# Patient Record
Sex: Female | Born: 1960 | Race: White | Hispanic: No | Marital: Married | Smoking: Never smoker
Health system: Southern US, Community
[De-identification: ages and names within clinical notes are randomized; demographics above are authoritative.]

## PROBLEM LIST (undated history)

## (undated) DIAGNOSIS — T7840XA Allergy, unspecified, initial encounter: Secondary | ICD-10-CM

## (undated) DIAGNOSIS — H532 Diplopia: Secondary | ICD-10-CM

## (undated) HISTORY — PX: ABDOMINAL HYSTERECTOMY: SHX81

## (undated) HISTORY — DX: Diplopia: H53.2

## (undated) HISTORY — PX: APPENDECTOMY: SHX54

## (undated) HISTORY — DX: Allergy, unspecified, initial encounter: T78.40XA

---

## 2012-09-20 ENCOUNTER — Ambulatory Visit: Payer: Self-pay | Admitting: Family

## 2012-10-29 ENCOUNTER — Ambulatory Visit: Payer: Self-pay | Admitting: Family Medicine

## 2012-12-02 ENCOUNTER — Ambulatory Visit (INDEPENDENT_AMBULATORY_CARE_PROVIDER_SITE_OTHER): Payer: Managed Care, Other (non HMO) | Admitting: Internal Medicine

## 2012-12-02 VITALS — BP 118/76 | HR 84 | Temp 99.1°F | Resp 16 | Ht 65.5 in | Wt 175.4 lb

## 2012-12-02 DIAGNOSIS — J039 Acute tonsillitis, unspecified: Secondary | ICD-10-CM

## 2012-12-02 NOTE — Progress Notes (Signed)
  Subjective:    Patient ID: Leah Foley, female    DOB: [birth year], 52 y.o.   MRN: [id number]  HPIc/o 24h of sore thr No cough or congestion Pos chills Just moved here    Review of Systems     Objective:   Physical Exam BP 118/76  Pulse 84  Temp(Src) 99.1 F (37.3 C) (Oral)  Resp 16  Ht 5' 5.5" (1.664 m)  Wt 175 lb 6.4 oz (79.561 kg)  BMI 28.73 kg/m2  SpO2 98% tms cl nar cl thr red w/ exudate 2+ac nodes tender ches cl   RS-neg      Assessment & Plan:  tonsilitis  Throat culture sent She elects symptomatic otc treatment until results

## 2012-12-03 ENCOUNTER — Telehealth: Payer: Self-pay

## 2012-12-03 NOTE — Telephone Encounter (Signed)
Pt was seen here yesterday and she is calling because her ears have been bothering her and her ear has popped and it seems like it is plugged up She also states that when she bends over it is a shooting pain Call back number [phone]

## 2012-12-04 MED ORDER — IPRATROPIUM BROMIDE 0.03 % NA SOLN: 2.0000 | Freq: Two times a day (BID) | NASAL | Status: DC

## 2012-12-04 NOTE — Telephone Encounter (Signed)
See OV-dx tonsillitis This could make ears hurt Awaiting thr cult results I can't remember if I looked inher ears or not tho epic says i did

## 2012-12-04 NOTE — Telephone Encounter (Signed)
Sent Atrovent nasal to pharmacy.  Use BID to help with ear pressure.  May also use Advil/Tylenol for pain relief.  If worsening or not improving, RTC.

## 2012-12-04 NOTE — Telephone Encounter (Signed)
My best guess w/out looking in the ears again would be to suspect an effusion due to her illness with possible infection and treat with Amoxicillin-then look in the ear again in 7-10 days to see if everything is back to normal---it needs to be looked at to prove it is well

## 2012-12-04 NOTE — Telephone Encounter (Signed)
Called her, to advise. Left message for her to call me back.

## 2012-12-04 NOTE — Telephone Encounter (Signed)
Lengthy conversation with patient. She does not want to try the nasal spray. She states Dr Merla Riches did not look in her ears, she also states she was "out of it" your note states TM's clear. She states she has been taking Tylenol and Advil. Has been rotating this. She does not want to come back in. She states she wants to resolve this, please advise.

## 2012-12-05 LAB — CULTURE, GROUP A STREP

## 2012-12-15 ENCOUNTER — Encounter: Payer: Self-pay | Admitting: Emergency Medicine

## 2012-12-15 ENCOUNTER — Emergency Department
Admission: EM | Admit: 2012-12-15 | Discharge: 2012-12-15 | Disposition: A | Discharge status: Home / Self Care | Payer: 59 | Source: Home / Self Care | Attending: Family Medicine | Admitting: Family Medicine

## 2012-12-15 DIAGNOSIS — M940 Chondrocostal junction syndrome [Tietze]: Secondary | ICD-10-CM

## 2012-12-15 DIAGNOSIS — B309 Viral conjunctivitis, unspecified: Secondary | ICD-10-CM

## 2012-12-15 DIAGNOSIS — J069 Acute upper respiratory infection, unspecified: Secondary | ICD-10-CM

## 2012-12-15 LAB — POCT CBC W AUTO DIFF (K'VILLE URGENT CARE)

## 2012-12-15 MED ORDER — POLYMYXIN B-TRIMETHOPRIM 10000-0.1 UNIT/ML-% OP SOLN: 1.0000 [drp] | OPHTHALMIC | Status: DC

## 2012-12-15 NOTE — ED Notes (Signed)
Reports onset of congestion, cough, eye and ear pain weeks ago; has been treated at 2 other facilities and still not resolved.

## 2012-12-15 NOTE — ED Provider Notes (Signed)
History     CSN: [id number]  Arrival date & time 12/15/12  1249   First MD Initiated Contact with Patient 12/15/12 1411      Chief Complaint  Patient presents with  . Cough  . Eye Pain  . Hoarse  . Otalgia       HPI Comments:  Patient states that two weeks ago she developed a mild sore throat, followed by nasal congestion and a cough.  She presented to another medical clinic where a strep test was negative.  She received an injectable steroid at that time.  Her cough has persisted.  Last night her right eye became red, and today both eyes are red but not painful.  She remains fatigued.  The history is provided by the patient.    Past Medical History   Diagnosis Date  . Allergy     Past Surgical History  Procedure Laterality Date  . Appendectomy    . Abdominal hysterectomy      Family History  Problem Relation Age of Onset  . Lupus Mother   . Cancer Father   . Kidney disease Maternal Grandfather     History  Substance Use Topics  . Smoking status: Never Smoker   . Smokeless tobacco: Not on file  . Alcohol Use: No    OB History   Grav Para Term Preterm Abortions TAB SAB Ect Mult Living                  Review of Systems + sore throat resolved + cough No pleuritic pain No wheezing + nasal congestion ? post-nasal drainage No sinus pain/pressure + itchy/red eyes ? earache No hemoptysis No SOB No fever/chills No nausea No vomiting No abdominal pain No diarrhea No urinary symptoms No skin rashes + fatigue No myalgias No headache    Allergies  Azithromycin and Sulfa antibiotics  Home Medications   Current Outpatient Rx  Name  Route  Sig  Dispense  Refill  . cefdinir (OMNICEF) 300 MG capsule   Oral   Take 300 mg by mouth 2 (two) times daily.         Marland Kitchen ipratropium (ATROVENT) 0.03 % nasal spray   Nasal   Place 2 sprays into the nose 2 (two) times daily.   30 mL   1   . trimethoprim-polymyxin b (POLYTRIM) ophthalmic solution   Both Eyes   Place 1 drop into both eyes every 4 (four) hours. (Rx void after 12/23/12)   10 mL   0     BP 112/74  Pulse 88  Temp(Src) 97.9 F (36.6 C) (Oral)  Resp 16  Ht 5\' 6"  (1.676 m)  Wt 174 lb (78.926 kg)  BMI 28.1 kg/m2  SpO2 99%  Physical Exam Nursing notes and Vital Signs reviewed. Appearance:  Patient appears healthy, stated age, and in no acute distress Eyes:  Pupils are equal, round, and reactive to light and accomodation.  Extraocular movement is intact.  Conjunctivae are injected bilaterally.  No discharge.  No photophobia Ears:  Canals normal.  Tympanic membranes normal.  Nose:  Mildly congested turbinates.  No sinus tenderness.  Pharynx:   Normal Neck:  Supple.  Slightly tender shotty posterior nodes are palpated bilaterally  Lungs:  Clear to auscultation.  Breath sounds are equal.  Chest:  Distinct tenderness to palpation over the mid-sternum.  Heart:  Regular rate and rhythm without murmurs, rubs, or gallops.  Abdomen:  Nontender without masses or hepatosplenomegaly.  Bowel sounds are present.  No CVA or flank tenderness.  Extremities:  No edema.  No calf tenderness Skin:  No rash present.   ED Course  Procedures  none  Labs Reviewed  POCT CBC W AUTO DIFF ([facility])  WBC 8.2; LY 27.5; MO 6.9; GR 65.6; Hgb 14.6; Platelets 298       1. Viral conjunctivitis   2. Acute upper respiratory infections of unspecified site; note normal white blood count.   3. Costochondritis       MDM   There is no evidence of bacterial infection today.   Treat symptomatically for now  Use saline nasal irrigation.  Try refrigerated "Refresh Tears" lubricating eye drops several times daily.  If eyes become increasingly red  and irritated, begin antibiotic eye drops (Rx given for PolyTrim) Followup with Family Doctor if not improved in one week.         Lattie Haw, MD 12/19/12 [phone]

## 2012-12-20 ENCOUNTER — Telehealth: Payer: Self-pay | Admitting: Emergency Medicine

## 2013-01-30 ENCOUNTER — Ambulatory Visit: Payer: Self-pay | Admitting: Family Medicine

## 2013-02-20 ENCOUNTER — Ambulatory Visit (INDEPENDENT_AMBULATORY_CARE_PROVIDER_SITE_OTHER): Payer: Self-pay | Admitting: Family Medicine

## 2013-02-20 ENCOUNTER — Encounter: Payer: Self-pay | Admitting: Family Medicine

## 2013-02-20 VITALS — BP 111/73 | HR 78 | Ht 65.25 in | Wt 173.0 lb

## 2013-02-20 DIAGNOSIS — J309 Allergic rhinitis, unspecified: Secondary | ICD-10-CM

## 2013-02-20 DIAGNOSIS — D179 Benign lipomatous neoplasm, unspecified: Secondary | ICD-10-CM

## 2013-02-20 DIAGNOSIS — Z0000 Encounter for general adult medical examination without abnormal findings: Secondary | ICD-10-CM

## 2013-02-20 MED ORDER — MONTELUKAST SODIUM 10 MG PO TABS: 10.0000 mg | ORAL_TABLET | Freq: Every day | ORAL | Status: DC

## 2013-02-20 MED ORDER — MOMETASONE FUROATE 50 MCG/ACT NA SUSP: 2.0000 | Freq: Every day | NASAL | Status: DC

## 2013-02-20 NOTE — Progress Notes (Signed)
  Subjective:    Patient ID: Leah Foley, female    DOB: 1960-09-04, 52 y.o.   MRN: [id number]  HPI  Leah Foley is here today to establish care with our practice.  She and her family recently moved to [facility] from [place] and she wants to have a PCP for her general care.  Since moving to the area, she has been in at a local urgent care for her allergies.  Her only concern today are her arms.  She has had several lumps on them for many years.  She denies any pain or changes on these lumps.   Review of Systems  Constitutional: Negative for fatigue and unexpected weight change.  HENT: Negative.   Respiratory: Negative.   Cardiovascular: Negative.   Gastrointestinal: Negative.   Genitourinary: Negative.   Musculoskeletal: Negative.   Skin:       Lumps in her left arm  Neurological: Negative.   Hematological: Negative.   Psychiatric/Behavioral: Negative.     Past Medical History  Diagnosis Date  . Allergy     Family History  Problem Relation Age of Onset  . Lupus Mother   . Cancer Father   . Kidney disease Maternal Grandfather    History   Social History Narrative   Marital Status:  Married Government social research officer)    Children:  Sons (3) Daughter (1)     Pets: None    Living Situation: Lives with her husband and daughter.     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 No distress.  HENT:  Head: Normocephalic.  Eyes: No scleral icterus.  Neck: No thyromegaly present.  Cardiovascular: Normal rate, regular rhythm and normal heart sounds.   Pulmonary/Chest: Effort normal and breath sounds normal.  Abdominal: There is no tenderness.  Musculoskeletal: She exhibits no edema and no tenderness.  She has several  lumps on her arms that appear to be lipomas.    Neurological: She is alert.  Skin: Skin is warm and dry.  Psychiatric: She has a normal mood and affect. Her behavior is normal. Judgment and thought content normal.          Assessment & Plan:

## 2013-02-24 LAB — CBC WITH DIFFERENTIAL/PLATELET
Basophils Absolute: 0 10*3/uL (ref 0.0–0.1)
Basophils Relative: 1 % (ref 0–1)
Eosinophils Absolute: 0.1 10*3/uL (ref 0.0–0.7)
Eosinophils Relative: 2 % (ref 0–5)
HCT: 44.3 % (ref 36.0–46.0)
Hemoglobin: 14.9 g/dL (ref 12.0–15.0)
Lymphocytes Relative: 38 % (ref 12–46)
Lymphs Abs: 2.2 10*3/uL (ref 0.7–4.0)
MCH: 29.7 pg (ref 26.0–34.0)
MCHC: 33.6 g/dL (ref 30.0–36.0)
MCV: 88.2 fL (ref 78.0–100.0)
Monocytes Absolute: 0.4 10*3/uL (ref 0.1–1.0)
Monocytes Relative: 7 % (ref 3–12)
Neutro Abs: 3 10*3/uL (ref 1.7–7.7)
Neutrophils Relative %: 52 % (ref 43–77)
Platelets: 205 10*3/uL (ref 150–400)
RBC: 5.02 MIL/uL (ref 3.87–5.11)
RDW: 13.8 % (ref 11.5–15.5)
WBC: 5.7 10*3/uL (ref 4.0–10.5)

## 2013-02-25 LAB — COMPLETE METABOLIC PANEL WITH GFR
ALT: 15 U/L (ref 0–35)
AST: 16 U/L (ref 0–37)
Albumin: 4.5 g/dL (ref 3.5–5.2)
Alkaline Phosphatase: 77 U/L (ref 39–117)
BUN: 10 mg/dL (ref 6–23)
CO2: 28 mEq/L (ref 19–32)
Calcium: 9.4 mg/dL (ref 8.4–10.5)
Chloride: 100 mEq/L (ref 96–112)
Creat: 0.61 mg/dL (ref 0.50–1.10)
GFR, Est African American: >89 mL/min
GFR, Est Non African American: >89 mL/min
Glucose, Bld: 87 mg/dL (ref 70–99)
Potassium: 4 mEq/L (ref 3.5–5.3)
Sodium: 138 mEq/L (ref 135–145)
Total Bilirubin: 0.9 mg/dL (ref 0.3–1.2)
Total Protein: 7.4 g/dL (ref 6.0–8.3)

## 2013-02-25 LAB — LIPID PANEL
Cholesterol: 224 mg/dL — ABNORMAL HIGH (ref 0–200)
HDL: 72 mg/dL (ref >39)
LDL Cholesterol: 121 mg/dL — ABNORMAL HIGH (ref 0–99)
Total CHOL/HDL Ratio: 3.1 Ratio
Triglycerides: 155 mg/dL — ABNORMAL HIGH (ref <150)
VLDL: 31 mg/dL (ref 0–40)

## 2013-02-25 LAB — TSH: TSH: 0.976 u[IU]/mL (ref 0.350–4.500)

## 2013-03-18 ENCOUNTER — Encounter: Payer: Self-pay | Admitting: Family Medicine

## 2013-03-18 ENCOUNTER — Ambulatory Visit (INDEPENDENT_AMBULATORY_CARE_PROVIDER_SITE_OTHER): Payer: Private Health Insurance - Indemnity | Admitting: Family Medicine

## 2013-03-18 VITALS — BP 107/73 | HR 81 | Resp 16 | Wt 173.0 lb

## 2013-03-18 DIAGNOSIS — E785 Hyperlipidemia, unspecified: Secondary | ICD-10-CM

## 2013-03-18 DIAGNOSIS — D179 Benign lipomatous neoplasm, unspecified: Secondary | ICD-10-CM | POA: Insufficient documentation

## 2013-03-18 DIAGNOSIS — J309 Allergic rhinitis, unspecified: Secondary | ICD-10-CM | POA: Insufficient documentation

## 2013-03-18 DIAGNOSIS — Z23 Encounter for immunization: Secondary | ICD-10-CM

## 2013-03-18 DIAGNOSIS — Z0000 Encounter for general adult medical examination without abnormal findings: Secondary | ICD-10-CM | POA: Insufficient documentation

## 2013-03-18 NOTE — Assessment & Plan Note (Signed)
It might be a good idea for her to see a surgeon to have them determine if she needs to have one of these lumps removed to confirm that they are just lipomas.

## 2013-03-18 NOTE — Progress Notes (Signed)
  Subjective:    Patient ID: Leah Foley, female    DOB: October 20, 1960, 52 y.o.   MRN: [id number]  HPI  Sonam is here today to go over her most recent lab results.  She also has additional questions about the lipomas on her arms.    Review of Systems  Constitutional: Negative for activity change, fatigue and unexpected weight change.  HENT: Negative.   Eyes: Negative.   Respiratory: Negative for shortness of breath.   Cardiovascular: Negative for chest pain, palpitations and leg swelling.  Gastrointestinal: Negative for diarrhea and constipation.  Endocrine: Negative.   Genitourinary: Negative for difficulty urinating.  Musculoskeletal: Negative.   Skin: Negative.        Lumps in her right arm   Neurological: Negative.   Hematological: Negative for adenopathy. Does not bruise/bleed easily.  Psychiatric/Behavioral: Negative for sleep disturbance and dysphoric mood. The patient is not nervous/anxious.     Past Medical History  Diagnosis Date  . Allergy     Family History  Problem Relation Age of Onset  . Lupus Mother   . Cancer Father   . Kidney disease Maternal Grandfather     History   Social History Narrative   Marital Status:  Married Government social research officer)    Children:  Sons (3) Daughter (1)     Pets: None    Living Situation: Lives with her husband and daughter.     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 Constitutional: She is oriented to person, place, and time.  Eyes: Conjunctivae are normal. No scleral icterus.  Neck: Neck supple. No thyromegaly present.  Cardiovascular: Normal rate, regular rhythm and normal heart sounds.   Pulmonary/Chest: Effort normal and breath sounds normal.  Musculoskeletal: She exhibits no edema  and no tenderness.  Lipomas in arms   Lymphadenopathy:    She has no cervical adenopathy.  Neurological: She is alert and oriented to person, place, and time.  Skin: Skin is warm and dry.  Psychiatric: She has a normal mood and affect. Her behavior is normal. Judgment and thought content normal.          Assessment & Plan:

## 2013-03-18 NOTE — Assessment & Plan Note (Signed)
She was given medications for Singulair and Nasonex and we discussed how and when she should take them.

## 2013-03-18 NOTE — Assessment & Plan Note (Signed)
She is to get labs then return for a CPE.

## 2013-03-18 NOTE — Patient Instructions (Addendum)
1)  Cholesterol  LDL (Bad)  - You can lower by increasing your fiber intake to 30 grams per day.  You can also add products with a "sterol" like the Smart Balance products or Benacol.  Limit red meat. Try the smoothie that we discussed.    HDL (Good)  - Your level is great!!  Triglycerides - Limit foods that have a high glycemic index.      Fat and Cholesterol Control Diet Cholesterol levels in your body are determined significantly by your diet. Cholesterol levels may also be related to heart disease. The following material helps to explain this relationship and discusses what you can do to help keep your heart healthy. Not all cholesterol is bad. Low-density lipoprotein (LDL) cholesterol is the "bad" cholesterol. It may cause fatty deposits to build up inside your arteries. High-density lipoprotein (HDL) cholesterol is "good." It helps to remove the "bad" LDL cholesterol from your blood. Cholesterol is a very important risk factor for heart disease. Other risk factors are high blood pressure, smoking, stress, heredity, and weight. The heart muscle gets its supply of blood through the coronary arteries. If your LDL cholesterol is high and your HDL cholesterol is low, you are at risk for having fatty deposits build up in your coronary arteries. This leaves less room through which blood can flow. Without sufficient blood and oxygen, the heart muscle cannot function properly and you may feel chest pains (angina pectoris). When a coronary artery closes up entirely, a part of the heart muscle may die causing a heart attack (myocardial infarction). CHECKING CHOLESTEROL When your caregiver sends your blood to a lab to be examined for cholesterol, a complete lipid (fat) profile may be done. With this test, the total amount of cholesterol and levels of LDL and HDL are determined. Triglycerides are a type of fat that circulates in the blood. They can also be used to determine heart disease risk. The list below  describes what the numbers should be: Test: Total Cholesterol.  Less than 200 mg/dl. Test: LDL "bad cholesterol."  Less than 100 mg/dl.  Less than 70 mg/dl if you are at very high risk of a heart attack or sudden cardiac death. Test: HDL "good cholesterol."  Greater than 50 mg/dl for women.  Greater than 40 mg/dl for men. Test: Triglycerides.  Less than 150 mg/dl. CONTROLLING CHOLESTEROL WITH DIET Although exercise and lifestyle factors are important, your diet is key. That is because certain foods are known to raise cholesterol and others to lower it. The goal is to balance foods for their effect on cholesterol and more importantly, to replace saturated and trans fat with other types of fat, such as monounsaturated fat, polyunsaturated fat, and omega-3 fatty acids. On average, a person should consume no more than 15 to 17 g of saturated fat daily. Saturated and trans fats are considered "bad" fats, and they will raise LDL cholesterol. Saturated fats are primarily found in animal products such as meats, butter, and cream. However, that does not mean you need to give up all your favorite foods. Today, there are good tasting, low-fat, low-cholesterol substitutes for most of the things you like to eat. Choose low-fat or nonfat alternatives. Choose round or loin cuts of red meat. These types of cuts are lowest in fat and cholesterol. Chicken (without the skin), fish, veal, and ground [place] breast are great choices. Eliminate fatty meats, such as hot dogs and salami. Even shellfish have little or no saturated fat. Have a 3 oz (  85 g) portion when you eat lean meat, poultry, or fish. Trans fats are also called "partially hydrogenated oils." They are oils that have been scientifically manipulated so that they are solid at room temperature resulting in a longer shelf life and improved taste and texture of foods in which they are added. Trans fats are found in stick margarine, some tub margarines,  cookies, crackers, and baked goods.  When baking and cooking, oils are a great substitute for butter. The monounsaturated oils are especially beneficial since it is believed they lower LDL and raise HDL. The oils you should avoid entirely are saturated tropical oils, such as coconut and palm.  Remember to eat a lot from food groups that are naturally free of saturated and trans fat, including fish, fruit, vegetables, beans, grains (barley, rice, couscous, bulgur wheat), and pasta (without cream sauces).  IDENTIFYING FOODS THAT LOWER CHOLESTEROL  Soluble fiber may lower your cholesterol. This type of fiber is found in fruits such as apples, vegetables such as broccoli, potatoes, and carrots, legumes such as beans, peas, and lentils, and grains such as barley. Foods fortified with plant sterols (phytosterol) may also lower cholesterol. You should eat at least 2 g per day of these foods for a cholesterol lowering effect.  Read package labels to identify low-saturated fats, trans fat free, and low-fat foods at the supermarket. Select cheeses that have only 2 to 3 g saturated fat per ounce. Use a heart-healthy tub margarine that is free of trans fats or partially hydrogenated oil. When buying baked goods (cookies, crackers), avoid partially hydrogenated oils. Breads and muffins should be made from whole grains (whole-wheat or whole oat flour, instead of "flour" or "enriched flour"). Buy non-creamy canned soups with reduced salt and no added fats.  FOOD PREPARATION TECHNIQUES  Never deep-fry. If you must fry, either stir-fry, which uses very little fat, or use non-stick cooking sprays. When possible, broil, bake, or roast meats, and steam vegetables. Instead of putting butter or margarine on vegetables, use lemon and herbs, applesauce, and cinnamon (for squash and sweet potatoes), nonfat yogurt, salsa, and low-fat dressings for salads.  LOW-SATURATED FAT / LOW-FAT FOOD SUBSTITUTES Meats / Saturated Fat  (g)  Avoid: Steak, marbled (3 oz/85 g) / 11 g  Choose: Steak, lean (3 oz/85 g) / 4 g  Avoid: Hamburger (3 oz/85 g) / 7 g  Choose: Hamburger, lean (3 oz/85 g) / 5 g  Avoid: Ham (3 oz/85 g) / 6 g  Choose: Ham, lean cut (3 oz/85 g) / 2.4 g  Avoid: Chicken, with skin, dark meat (3 oz/85 g) / 4 g  Choose: Chicken, skin removed, dark meat (3 oz/85 g) / 2 g  Avoid: Chicken, with skin, light meat (3 oz/85 g) / 2.5 g  Choose: Chicken, skin removed, light meat (3 oz/85 g) / 1 g Dairy / Saturated Fat (g)  Avoid: Whole milk (1 cup) / 5 g  Choose: Low-fat milk, 2% (1 cup) / 3 g  Choose: Low-fat milk, 1% (1 cup) / 1.5 g  Choose: Skim milk (1 cup) / 0.3 g  Avoid: Hard cheese (1 oz/28 g) / 6 g  Choose: Skim milk cheese (1 oz/28 g) / 2 to 3 g  Avoid: Cottage cheese, 4% fat (1 cup) / 6.5 g  Choose: Low-fat cottage cheese, 1% fat (1 cup) / 1.5 g  Avoid: Ice cream (1 cup) / 9 g  Choose: Sherbet (1 cup) / 2.5 g  Choose: Nonfat frozen yogurt (1 cup) /  0.3 g  Choose: Frozen fruit bar / trace  Avoid: Whipped cream (1 tbs) / 3.5 g  Choose: Nondairy whipped topping (1 tbs) / 1 g Condiments / Saturated Fat (g)  Avoid: Mayonnaise (1 tbs) / 2 g  Choose: Low-fat mayonnaise (1 tbs) / 1 g  Avoid: Butter (1 tbs) / 7 g  Choose: Extra light margarine (1 tbs) / 1 g  Avoid: Coconut oil (1 tbs) / 11.8 g  Choose: Olive oil (1 tbs) / 1.8 g  Choose: Corn oil (1 tbs) / 1.7 g  Choose: Safflower oil (1 tbs) / 1.2 g  Choose: Sunflower oil (1 tbs) / 1.4 g  Choose: Soybean oil (1 tbs) / 2.4 g  Choose: Canola oil (1 tbs) / 1 g Document Released: 07/24/2005 Document Revised: 10/16/2011 Document Reviewed: 01/12/2011 Exit[employer] Patient Information 2014 [place], [place].

## 2013-05-03 DIAGNOSIS — E785 Hyperlipidemia, unspecified: Secondary | ICD-10-CM | POA: Insufficient documentation

## 2013-05-03 DIAGNOSIS — Z23 Encounter for immunization: Secondary | ICD-10-CM | POA: Insufficient documentation

## 2013-05-03 NOTE — Assessment & Plan Note (Signed)
She is to follow up with a surgeon in the future to further discuss the lipomas on her arms.

## 2013-05-03 NOTE — Assessment & Plan Note (Signed)
She received a Tdap without difficulty.   

## 2013-05-03 NOTE — Assessment & Plan Note (Signed)
She is going to work harder on her diet and exercise.   

## 2013-09-22 ENCOUNTER — Encounter: Payer: Self-pay | Admitting: Physician Assistant

## 2013-09-22 ENCOUNTER — Ambulatory Visit (INDEPENDENT_AMBULATORY_CARE_PROVIDER_SITE_OTHER): Payer: Private Health Insurance - Indemnity | Admitting: Physician Assistant

## 2013-09-22 VITALS — BP 114/74 | HR 80 | Ht 66.0 in | Wt 172.0 lb

## 2013-09-22 DIAGNOSIS — Z1211 Encounter for screening for malignant neoplasm of colon: Secondary | ICD-10-CM

## 2013-09-22 DIAGNOSIS — Z1239 Encounter for other screening for malignant neoplasm of breast: Secondary | ICD-10-CM

## 2013-09-22 DIAGNOSIS — G479 Sleep disorder, unspecified: Secondary | ICD-10-CM

## 2013-09-22 DIAGNOSIS — Z13820 Encounter for screening for osteoporosis: Secondary | ICD-10-CM

## 2013-09-22 DIAGNOSIS — D179 Benign lipomatous neoplasm, unspecified: Secondary | ICD-10-CM

## 2013-09-22 NOTE — Patient Instructions (Addendum)
[facility]. (Valarian Root) 300mg  at bedtime.   Vitamin C 1200mg  and Vitamin D 800 units.

## 2013-09-24 NOTE — Progress Notes (Signed)
Subjective:    Patient ID: Leah Foley, female    DOB: 12/05/1960, 53 y.o.   MRN: [id number]  HPI Pt is a 53 yo WF who is a new pt to establish care. She is in need to get all screenings up to date. She does have a few concerns with not being able to go to sleep and stay asleep. She has tried melatonin and did not help at night but made her more sleepy during the day. She snores some but not every  She has not tried anything else to sleep she does not want a prescription medication..   Pt also wants second opinion on notes on left arm. She has 2 nodules they have been there for over a year and are not painful and do not seem to grow. She has been told before they are benign.   . Active Ambulatory Problems    Diagnosis Date Noted  . Allergic rhinitis 03/18/2013  . Routine general medical examination at a health care facility 03/18/2013  . Lipoma 03/18/2013  . Need for prophylactic vaccination with combined diphtheria-tetanus-pertussis (DTP) vaccine 05/03/2013  . Other and unspecified hyperlipidemia 05/03/2013  . Difficulty sleeping 09/22/2013   Resolved Ambulatory Problems    Diagnosis Date Noted  . No Resolved Ambulatory Problems   Past Medical History  Diagnosis Date  . Allergy    . Family History  Problem Relation Age of Onset  . Lupus Mother   . Cancer Father   . Kidney disease Maternal Grandfather    . History   Social History  . Marital Status: Married    Spouse Name: [name]     Number of Children: [phone]  . Years of Education: 14   Occupational History  . HOMEMAKER     Social History Main Topics  . Smoking status: Never Smoker   . Smokeless tobacco: Never Used  . Alcohol Use: No  . Drug Use: No  . Sexual Activity: Yes     Comment: married, 1 partner   Other Topics Concern  . Not on file   Social History Narrative   Marital Status:  Married Public relations account executive)    Children:  Sons (3) Daughter (1)     Pets: None    Living Situation: Lives with her husband and  daughter.     Occupation:  Agricultural engineer    Education:  Designer, industrial/product in [place] Use/Exposure:  None    Alcohol Use:  Occasional   Drug Use:  None   Diet:  Regular   Exercise:  Cardio - 30 minutes daily   Hobbies:  Gardening                Review of Systems  All other systems reviewed and are negative.       Objective:   Physical Exam  Constitutional: She is oriented to person, place, and time. She appears well-developed and well-nourished.  HENT:  Head: Normocephalic and atraumatic.  Cardiovascular: Normal rate, regular rhythm and normal heart sounds.   Pulmonary/Chest: Effort normal and breath sounds normal.  Neurological: She is alert and oriented to person, place, and time.  Skin: Skin is dry.     Psychiatric: She has a normal mood and affect. Her behavior is normal.          Assessment & Plan:  Difficultly sleeping- Discussed prescription medication. Pt does not want to precede at this time. Recommended to try valerian root or go to [employer] to  try some natural remendy or stress relief with esstential oils. If not improving and wants to consider medication then can follow up. Encouraged good sleep hygiene with schedule and order.   Lipoma- discussed with pt arm nodules do appear to be benign and consistent with lipoma. If growing or concerned can send for excision of one to confirm. At this time pt with wait.   Ordered mammogram, bone density, colononscopy.  Screening labs are up to date.

## 2013-09-24 NOTE — Addendum Note (Signed)
Addended by: Donella Stade on: 09/24/2013 08:03 AM   Modules accepted: Orders

## 2015-04-01 ENCOUNTER — Encounter: Payer: Self-pay | Admitting: Osteopathic Medicine

## 2015-04-01 ENCOUNTER — Ambulatory Visit (INDEPENDENT_AMBULATORY_CARE_PROVIDER_SITE_OTHER): Payer: Private Health Insurance - Indemnity | Admitting: Osteopathic Medicine

## 2015-04-01 VITALS — BP 119/73 | HR 91 | Ht 66.0 in | Wt 178.0 lb

## 2015-04-01 DIAGNOSIS — K649 Unspecified hemorrhoids: Secondary | ICD-10-CM

## 2015-04-01 DIAGNOSIS — K5900 Constipation, unspecified: Secondary | ICD-10-CM | POA: Diagnosis not present

## 2015-04-01 DIAGNOSIS — K921 Melena: Secondary | ICD-10-CM | POA: Diagnosis not present

## 2015-04-01 NOTE — Progress Notes (Signed)
HPI: Leah Foley is a 53 y.o. female who presents to [facility]  today for chief complaint of: blood in stool  . Quality: bright red blood, no dark/tarry stool . Severity: minimal/moderate/substantial amount  . Duration: started today . Timing: one BM today, also noticed some blood on underwear, has stopped.  . Assoc signs/symp, patient reports she had a normal colonoscopy 2 years agotoms: no diarrhea, (+)constipation, no abdominal pain     Past medical, social and family history reviewed: Past Medical History  Diagnosis Date  . Allergy    Past Surgical History  Procedure Laterality Date  . Appendectomy    . Abdominal hysterectomy     Social History  Substance Use Topics  . Smoking status: Never Smoker   . Smokeless tobacco: Never Used  . Alcohol Use: No   Family History  Problem Relation Age of Onset  . Lupus Mother   . Cancer Father   . Kidney disease Maternal Grandfather     Current Outpatient Prescriptions  Medication Sig Dispense Refill  . Multiple Vitamin (MULTIVITAMIN) tablet Take 1 tablet by mouth daily.     No current facility-administered medications for this visit.   Allergies  Allergen Reactions  . Azithromycin Palpitations  . Sulfa Antibiotics Rash     Review of Systems: CONSTITUTIONAL: Neg fever/chills, no unintentional weight changes GASTROINTESTINAL: No nausea/vomiting/abdominal pain/diarrhea (+) blood in stool/BRBPR/constipation MUSCULOSKELETAL: No myalgia/arthralgia GENITOURINARY: No incontinence, No abnormal genital bleeding/discharge HEM/ONC: No easy bruising/bleeding, no abnormal lymph node   Exam:  BP 119/73 mmHg  Pulse 91  Ht 5\' 6"  (1.676 m)  Wt 178 lb (80.74 kg)  BMI 28.74 kg/m2 Constitutional: VSS, see above. General Appearance: alert, well-developed, well-nourished, NAD Gastrointestinal: Nontender, no masses. No hernia appreciated. Rectal exam normal tone, no masses appreciated, scant bright  red blood and normal brown stool on glove.    No results found for this or any previous visit (from the past 72 hour(s)).    ASSESSMENT/PLAN:  Blood in stool  Hemorrhoids, unspecified hemorrhoid type - likely internal  Constipation, unspecified constipation type   No significant concerns on exam, patient reports normal colonoscopy 2 yr ago, we discussed lifestyle  modifications to decrease constipationt including increased fiber and good hydration. If abdominal pain/ dark tarry stools develop she should go to emergency room. If bleeding persists or becomes more of a concern we can discuss possible GI referral for further eval/treat

## 2015-04-01 NOTE — Patient Instructions (Signed)
Constipation  HOME CARE INSTRUCTIONS   Eat foods that have a lot of fiber, such as fruits, vegetables, whole grains, and beans.  Limit foods high in fat and processed sugars, such as french fries, hamburgers, cookies, candies, and soda.   A fiber supplement may be added to your diet if you cannot get enough fiber from foods.   Drink enough fluids to keep your urine clear or pale yellow.   Exercise regularly or as directed by your health care provider.   Go to the restroom when you have the urge to go. Do not hold it.   Only take over-the-counter or prescription medicines as directed by your health care provider. Do not take other medicines for constipation without talking to your health care provider first.  [place] IF:   You have bright red blood in your stool.   Your constipation lasts for more than 4 days or gets worse.   You have abdominal or rectal pain.   You have thin, pencil-like stools.   You have unexplained weight loss. MAKE SURE YOU:   Understand these instructions.  Will watch your condition.  Will get help right away if you are not doing well or get worse. Document Released: 04/21/2004 Document Revised: 07/29/2013 Document Reviewed: 05/05/2013 [facility] Patient Information 2015 [place], [place]. This information is not intended to replace advice given to you by your health care provider. Make sure you discuss any questions you have with your health care provider.

## 2015-11-08 ENCOUNTER — Other Ambulatory Visit: Payer: Self-pay | Admitting: Physician Assistant

## 2015-11-08 ENCOUNTER — Encounter: Payer: Self-pay | Admitting: Physician Assistant

## 2015-11-08 ENCOUNTER — Ambulatory Visit (INDEPENDENT_AMBULATORY_CARE_PROVIDER_SITE_OTHER): Payer: Private Health Insurance - Indemnity | Admitting: Physician Assistant

## 2015-11-08 ENCOUNTER — Ambulatory Visit (INDEPENDENT_AMBULATORY_CARE_PROVIDER_SITE_OTHER): Payer: Managed Care, Other (non HMO)

## 2015-11-08 VITALS — BP 117/68 | HR 80 | Ht 66.0 in | Wt 174.0 lb

## 2015-11-08 DIAGNOSIS — M7051 Other bursitis of knee, right knee: Secondary | ICD-10-CM

## 2015-11-08 DIAGNOSIS — M25562 Pain in left knee: Secondary | ICD-10-CM

## 2015-11-08 NOTE — Progress Notes (Signed)
   Subjective:    Patient ID: Leah Foley, female    DOB: August 10, 1960, 55 y.o.   MRN: [id number]  HPI Pt is a 55 yo female who presents to the clinic with left knee pain only when kneeling for last 2 weeks. She slipped while bending down and has noticed pain when her knee hits the ground every time. Denies any trauma other than fall. She "feels like something is loose in knee". No pain other than with pressure. Not tried anything to make better.   Review of Systems  All other systems reviewed and are negative.      Objective:   Physical Exam  Constitutional: She is oriented to person, place, and time. She appears well-developed and well-nourished.  HENT:  Head: Normocephalic and atraumatic.  Musculoskeletal:  Left knee: NROM and strength.  No pain to palpation over joint spaces or patella.  No effusion or brusing.  Only pain when bending down and leaning on left knee and the pain is left lower knee below joint space.  Neurological: She is alert and oriented to person, place, and time.  Skin: Skin is dry.  Psychiatric: She has a normal mood and affect. Her behavior is normal.          Assessment & Plan:  Left knee pain- will get xray and one of right to compare knees. Would like to make sure no loose body or foreign body. No treatment plan discussed today. Will follow up as needed.

## 2016-06-05 ENCOUNTER — Ambulatory Visit (INDEPENDENT_AMBULATORY_CARE_PROVIDER_SITE_OTHER): Payer: Managed Care, Other (non HMO) | Admitting: Physician Assistant

## 2016-06-05 ENCOUNTER — Encounter: Payer: Self-pay | Admitting: Physician Assistant

## 2016-06-05 VITALS — BP 126/69 | HR 85 | Ht 66.0 in | Wt 174.0 lb

## 2016-06-05 DIAGNOSIS — Z131 Encounter for screening for diabetes mellitus: Secondary | ICD-10-CM

## 2016-06-05 DIAGNOSIS — R229 Localized swelling, mass and lump, unspecified: Secondary | ICD-10-CM

## 2016-06-05 DIAGNOSIS — Z0000 Encounter for general adult medical examination without abnormal findings: Secondary | ICD-10-CM

## 2016-06-05 DIAGNOSIS — Z1159 Encounter for screening for other viral diseases: Secondary | ICD-10-CM

## 2016-06-05 DIAGNOSIS — Z13220 Encounter for screening for lipoid disorders: Secondary | ICD-10-CM | POA: Diagnosis not present

## 2016-06-05 NOTE — Patient Instructions (Signed)

## 2016-06-06 ENCOUNTER — Encounter: Payer: Self-pay | Admitting: Physician Assistant

## 2016-06-06 DIAGNOSIS — R229 Localized swelling, mass and lump, unspecified: Secondary | ICD-10-CM | POA: Insufficient documentation

## 2016-06-06 NOTE — Progress Notes (Signed)
Subjective:     Leah Foley is a 55 y.o. female and is here for a comprehensive physical exam. The patient reports no problems.  Social History   Social History  . Marital status: Married    Spouse name: [name]   . Number of children: [id number]  . Years of education: 15   Occupational History  . HOMEMAKER     Social History Main Topics  . Smoking status: Never Smoker  . Smokeless tobacco: Never Used  . Alcohol use No  . Drug use: No  . Sexual activity: Yes     Comment: married, 1 partner   Other Topics Concern  . 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     Occupation:  Agricultural engineer    Education:  Designer, industrial/product in [place] Use/Exposure:  None    Alcohol Use:  Occasional   Drug Use:  None   Diet:  Regular   Exercise:  Cardio - 30 minutes daily   Hobbies:  Gardening             Health Maintenance  Topic Date Due  . Hepatitis C Screening  10/25/1960  . COLONOSCOPY  08/08/2015  . INFLUENZA VACCINE  06/05/2017 (Originally 03/07/2016)  . MAMMOGRAM  06/05/2018 (Originally 05/07/2014)  . PAP SMEAR  09/23/2023 (Originally 01/10/1980)  . HIV Screening  06/05/2026 (Originally 01/09/1974)  . TETANUS/TDAP  03/19/2023    The following portions of the patient's history were reviewed and updated as appropriate: allergies, current medications, past family history, past medical history, past social history, past surgical history and problem list.  Review of Systems A comprehensive review of systems was negative.   Objective:    BP 126/69   Pulse 85   Ht 5\' 6"  (1.676 m)   Wt 174 lb (78.9 kg)   BMI 28.08 kg/m  General appearance: alert, cooperative and appears stated age Head: Normocephalic, without obvious abnormality, atraumatic Eyes: conjunctivae/corneas clear. PERRL, EOM's intact. Fundi benign. Ears: normal TM's and  external ear canals both ears Nose: Nares normal. Septum midline. Mucosa normal. No drainage or sinus tenderness. Throat: lips, mucosa, and tongue normal; teeth and gums normal Neck: no adenopathy, no carotid bruit, no JVD, supple, symmetrical, trachea midline and thyroid not enlarged, symmetric, no tenderness/mass/nodules Back: symmetric, no curvature. ROM normal. No CVA tenderness. Lungs: clear to auscultation bilaterally Heart: regular rate and rhythm, S1, S2 normal, no murmur, click, rub or gallop Abdomen: soft, non-tender; bowel sounds normal; no masses,  no organomegaly Extremities: extremities normal, atraumatic, no cyanosis or edema Pulses: 2+ and symmetric Skin: Skin color, texture, turgor normal. No rashes or lesions mobile non tender nodule right middle back around T11.  Lymph nodes: Cervical, supraclavicular, and axillary nodes normal. Neurologic: Alert and oriented X 3, normal strength and tone. Normal symmetric reflexes. Normal coordination and gait    Breast exam: pt declined.    Assessment:    Healthy female exam.      Plan:   Marland KitchenMarland KitchenShakeima was seen today for annual exam.  Diagnoses and all orders for this visit:  Routine physical examination -     Hepatitis C Antibody -     Lipid panel -     COMPLETE METABOLIC PANEL WITH GFR  Screening for diabetes mellitus -  COMPLETE METABOLIC PANEL WITH GFR  Screening for lipid disorders -     Lipid panel  Need for hepatitis C screening test -     Hepatitis C Antibody  Single skin nodule   Reassurance given about skin nodule. Appears like lipoma. Continue to monitor and let me know if getting bigger or becoming painful.   Pt declined breast exam, flu shot, pap smear and Tdap.  She stated would schedule her colonoscopy.    See After Visit Summary for Counseling Recommendations

## 2016-06-20 ENCOUNTER — Ambulatory Visit: Payer: Managed Care, Other (non HMO) | Admitting: Family Medicine

## 2016-06-21 LAB — LIPID PANEL
CHOL/HDL RATIO: 3 ratio (ref <5.0)
CHOLESTEROL: 260 mg/dL — AB (ref <200)
HDL: 88 mg/dL (ref >50)
LDL Cholesterol: 147 mg/dL — ABNORMAL HIGH (ref <100)
TRIGLYCERIDES: 124 mg/dL (ref <150)
VLDL: 25 mg/dL (ref <30)

## 2016-06-21 LAB — COMPLETE METABOLIC PANEL WITH GFR
ALBUMIN: 4.4 g/dL (ref 3.6–5.1)
ALK PHOS: 67 U/L (ref 33–130)
ALT: 16 U/L (ref 6–29)
AST: 17 U/L (ref 10–35)
BUN: 12 mg/dL (ref 7–25)
CALCIUM: 9.4 mg/dL (ref 8.6–10.4)
CO2: 28 mmol/L (ref 20–31)
CREATININE: 0.65 mg/dL (ref 0.50–1.05)
Chloride: 102 mmol/L (ref 98–110)
GFR, Est Non African American: >89 mL/min (ref >=60)
Glucose, Bld: 94 mg/dL (ref 65–99)
Potassium: 4 mmol/L (ref 3.5–5.3)
Sodium: 137 mmol/L (ref 135–146)
Total Bilirubin: 0.9 mg/dL (ref 0.2–1.2)
Total Protein: 6.8 g/dL (ref 6.1–8.1)

## 2016-06-21 LAB — HEPATITIS C ANTIBODY: HCV Ab: NEGATIVE

## 2016-07-21 ENCOUNTER — Encounter: Payer: Self-pay | Admitting: Physician Assistant

## 2016-07-21 ENCOUNTER — Ambulatory Visit (INDEPENDENT_AMBULATORY_CARE_PROVIDER_SITE_OTHER): Payer: Managed Care, Other (non HMO) | Admitting: Physician Assistant

## 2016-07-21 VITALS — BP 124/78 | HR 93 | Temp 98.2°F | Wt 177.0 lb

## 2016-07-21 DIAGNOSIS — J0120 Acute ethmoidal sinusitis, unspecified: Secondary | ICD-10-CM | POA: Diagnosis not present

## 2016-07-21 MED ORDER — AMOXICILLIN 875 MG PO TABS: 875.0000 mg | ORAL_TABLET | Freq: Two times a day (BID) | ORAL | 0 refills | Status: DC

## 2016-07-21 MED ORDER — IPRATROPIUM BROMIDE 0.06 % NA SOLN: 1.0000 | Freq: Four times a day (QID) | NASAL | 0 refills | Status: DC | PRN

## 2016-07-21 NOTE — Patient Instructions (Signed)

## 2016-07-21 NOTE — Progress Notes (Signed)
Leah Foley is a 55 y.o. female who presents to [facility] [place] today for sinus congestion  Sinus Problem  This is a new problem. Episode onset: 11 days ago. The problem is unchanged. There has been no fever. She is experiencing no pain. Associated symptoms include congestion, diaphoresis and sinus pressure. Pertinent negatives include no coughing, ear pain, headaches, hoarse voice or sore throat. Past treatments include saline sprays (netty pot, humidifier). The treatment provided moderate relief.   She was recently evaluated by Dr. Vicente Masson in ENT at [facility] on 07/05/16 for 3 months of left periorbital/retro-orbital pressure. Anterior rhinoscopy was performed. It was recommended that she use afrin prn for congestion and could have septoplasty and b/l IT reduction for deviated septum.    Past Medical History:  Diagnosis Date  . Allergy    Past Surgical History:  Procedure Laterality Date  . ABDOMINAL HYSTERECTOMY    . APPENDECTOMY     Social History  Substance Use Topics  . Smoking status: Never Smoker  . Smokeless tobacco: Never Used  . Alcohol use No   family history includes Cancer in her father; Kidney disease in her maternal grandfather; Lupus in her mother.  Review of Systems  Constitutional: Positive for diaphoresis and malaise/fatigue. Negative for fever.  HENT: Positive for congestion, sinus pain (pressure behind left eye, popping in left ear) and sinus pressure. Negative for ear pain, hoarse voice and sore throat.   Eyes: Negative.   Respiratory: Negative.  Negative for cough.   Cardiovascular: Negative.   Gastrointestinal: Negative for nausea and vomiting.  Genitourinary: Negative.   Musculoskeletal: Negative for myalgias.  Skin: Negative for rash.  Neurological: Negative for dizziness and headaches.    Medications: Current Outpatient Prescriptions   Medication Sig Dispense Refill  . Multiple Vitamin (MULTIVITAMIN) tablet Take 1 tablet by mouth daily.    Marland Kitchen amoxicillin (AMOXIL) 875 MG tablet Take 1 tablet (875 mg total) by mouth 2 (two) times daily. 20 tablet 0  . ipratropium (ATROVENT) 0.06 % nasal spray Place 1 spray into both nostrils 4 (four) times daily as needed for rhinitis. 15 mL 0   No current facility-administered medications for this visit.    Allergies  Allergen Reactions  . Azithromycin Palpitations  . Sulfa Antibiotics Rash    Health Maintenance Health Maintenance  Topic Date Due  . COLONOSCOPY  08/08/2015  . INFLUENZA VACCINE  06/05/2017 (Originally 03/07/2016)  . MAMMOGRAM  06/05/2018 (Originally 05/07/2014)  . PAP SMEAR  09/23/2023 (Originally 01/10/1980)  . HIV Screening  06/05/2026 (Originally 01/09/1974)  . TETANUS/TDAP  03/19/2023  . Hepatitis C Screening  Completed     Objective:  BP 124/78   Pulse 93   Temp 98.2 F (36.8 C) (Oral)   Wt 177 lb (80.3 kg)   BMI 28.57 kg/m  Physical Exam  Constitutional: Vital signs are normal. She appears not lethargic. No distress.  HENT:  Right Ear: External ear normal. No tenderness.  Left Ear: External ear normal. No tenderness. A middle ear effusion is present.  Nose: Mucosal edema and septal deviation present. No rhinorrhea. No epistaxis. Right sinus exhibits maxillary sinus tenderness. Right sinus exhibits no frontal sinus tenderness. Left sinus exhibits maxillary sinus tenderness. Left sinus exhibits no frontal sinus tenderness.  Mouth/Throat: Oropharynx is clear and moist and mucous membranes are normal.  Cardiovascular: Normal rate, regular rhythm, S1 normal and S2 normal.   Pulmonary/Chest: Effort normal and breath sounds normal. She has no wheezes. She has no  rhonchi. She has no rales.  Neurological: She appears not lethargic.  Skin: No rash noted. She is diaphoretic. No cyanosis.   I personally reviewed the outside records from ENT  Assessment and  Plan: 54 y.o. female with history of allergic rhinitis presenting with sinus pressure and nasal congestion x 11 days. She has a deviated septum and nasal turbinate hypertrophy that predisposes her to sinus infection. Reasonable to start antibiotics today. Tylenol 325 mg prn for fever and myalgias  1. Acute non-recurrent ethmoidal sinusitis - amoxicillin (AMOXIL) 875 MG tablet; Take 1 tablet (875 mg total) by mouth 2 (two) times daily.  Dispense: 20 tablet; Refill: 0 - ipratropium (ATROVENT) 0.06 % nasal spray; Place 1 spray into both nostrils 4 (four) times daily as needed for rhinitis.  Dispense: 15 mL; Refill: 0   Patient education and anticipatory guidance given Patient agrees with treatment plan Follow-up as needed  Darlyne Russian PA-C

## 2016-07-25 ENCOUNTER — Telehealth: Payer: Self-pay

## 2016-07-25 NOTE — Telephone Encounter (Signed)
Patient's symptoms are not related to the Amoxicillin. It is likely the headache and joint pain are symptoms of a viral illness. My recommendation is that she continue the Amoxicillin, take Tylenol for headache/arthralgias, and make an appointment to see either myself or another provider tomorrow 12/20.  Darlyne Russian PA-C

## 2016-07-25 NOTE — Telephone Encounter (Signed)
Leah Foley called and left a message stating on the 3 rd day of taking the amoxicillin she has notice joint pain and headaches. Is this a normal side effect for the medication and should she continue taking medication. Please advise.

## 2016-07-26 NOTE — Telephone Encounter (Signed)
Patient advised of recommendations.  

## 2017-02-07 ENCOUNTER — Emergency Department (INDEPENDENT_AMBULATORY_CARE_PROVIDER_SITE_OTHER): Payer: Commercial Managed Care - PPO

## 2017-02-07 ENCOUNTER — Emergency Department
Admission: EM | Admit: 2017-02-07 | Discharge: 2017-02-07 | Disposition: A | Discharge status: Home / Self Care | Payer: Commercial Managed Care - PPO | Source: Home / Self Care | Attending: Family Medicine | Admitting: Family Medicine

## 2017-02-07 DIAGNOSIS — M25531 Pain in right wrist: Secondary | ICD-10-CM

## 2017-02-07 DIAGNOSIS — M7989 Other specified soft tissue disorders: Secondary | ICD-10-CM | POA: Diagnosis not present

## 2017-02-07 DIAGNOSIS — S6991XA Unspecified injury of right wrist, hand and finger(s), initial encounter: Secondary | ICD-10-CM | POA: Diagnosis not present

## 2017-02-07 DIAGNOSIS — M25521 Pain in right elbow: Secondary | ICD-10-CM

## 2017-02-07 DIAGNOSIS — M25541 Pain in joints of right hand: Secondary | ICD-10-CM

## 2017-02-07 DIAGNOSIS — W010XXA Fall on same level from slipping, tripping and stumbling without subsequent striking against object, initial encounter: Secondary | ICD-10-CM

## 2017-02-07 DIAGNOSIS — M79601 Pain in right arm: Secondary | ICD-10-CM

## 2017-02-07 NOTE — ED Triage Notes (Signed)
Pt fell against corner in foyer last night around 11.  Right hand bruised and swollen, with shooting pain into elbow.

## 2017-02-07 NOTE — ED Notes (Signed)
To x-ray

## 2017-02-07 NOTE — ED Provider Notes (Signed)
CSN: [id number]     Arrival date & time 02/07/17  1240 History   First MD Initiated Contact with Patient 02/07/17 1258     Chief Complaint  Patient presents with  . Hand Pain   (Consider location/radiation/quality/duration/timing/severity/associated sxs/prior Treatment) HPI  Leah Foley is a 56 y.o. female presenting to UC with c/o Right arm pain from elbow down to her fingers after she tripped and fell last night at home, hitting her Right arm at an angle into the fall.  Pain is aching and sore, radiating from Right thumb up into crease of elbow.  Pain is 4/10 at this time, mild relief with ibuprofen taken this morning. She has noticed bruising and swelling. She is Right hand dominant. No there injuries from the fall.    Past Medical History:  Diagnosis Date  . Allergy    Past Surgical History:  Procedure Laterality Date  . ABDOMINAL HYSTERECTOMY    . APPENDECTOMY     Family History  Problem Relation Age of Onset  . Lupus Mother   . Cancer Father   . Kidney disease Maternal Grandfather    Social History  Substance Use Topics  . Smoking status: Never Smoker  . Smokeless tobacco: Never Used  . Alcohol use No   OB History    No data available     Review of Systems  Musculoskeletal: Positive for arthralgias, joint swelling and myalgias. Negative for neck pain.  Skin: Positive for color change and wound. Negative for rash.  Neurological: Positive for weakness. Negative for numbness.    Allergies  Azithromycin and Sulfa antibiotics  Home Medications   Prior to Admission medications   Medication Sig Start Date End Date Taking? Authorizing Provider  amoxicillin (AMOXIL) 875 MG tablet Take 1 tablet (875 mg total) by mouth 2 (two) times daily. 07/21/16   Trixie Dredge, PA-C  ipratropium (ATROVENT) 0.06 % nasal spray Place 1 spray into both nostrils 4 (four) times daily as needed for rhinitis. 07/21/16   Trixie Dredge, PA-C  Multiple Vitamin  (MULTIVITAMIN) tablet Take 1 tablet by mouth daily.    [provider]   Meds Ordered and Administered this Visit  Medications - No data to display  BP 122/84 (BP Location: Left Arm)   Pulse 70   Temp 98.2 F (36.8 C) (Oral)   Ht 5\' 6"  (1.676 m)   Wt 178 lb (80.7 kg)   SpO2 97%   BMI 28.73 kg/m  No data found.   Physical Exam  Constitutional: She is oriented to person, place, and time. She appears well-developed and well-nourished.  HENT:  Head: Normocephalic and atraumatic.  Eyes: EOM are normal.  Neck: Normal range of motion.  Cardiovascular: Normal rate.   Pulmonary/Chest: Effort normal.  Musculoskeletal: She exhibits edema and tenderness.  Right wrist and hand: mild edema to radial aspect, most around 1st and 2nd metacarpal. Limited ROM of thumb due to pain. Minimal tenderness to snuff box. Full ROM wrist and elbow. No bony tenderness to Right elbow. Mild tenderness to proximal muscles in elbow and over AC space. Muscle compartments are soft.   Neurological: She is alert and oriented to person, place, and time.  Skin: Skin is warm and dry.  Right hand and wrist: superficial abrasion over 1st metacarpal with faint ecchymosis and mild to moderate edema.   Psychiatric: She has a normal mood and affect. Her behavior is normal.  Nursing note and vitals reviewed.   Urgent Care Course  Procedures (including critical care time)  Labs Review Labs Reviewed - No data to display  Imaging Review Dg Elbow Complete Right (3+view)  Result Date: 02/07/2017 CLINICAL DATA:  Right elbow pain after fall last evening. EXAM: RIGHT ELBOW - COMPLETE 3+ VIEW COMPARISON:  None. FINDINGS: There is no evidence of fracture, dislocation, or joint effusion. Minimal spurring off the humeral epicondyles may reflect stigmata of old epicondylitis. No intra-articular loose bodies. Soft tissues are unremarkable. IMPRESSION: No acute fracture, dislocation or joint effusion of the right elbow.  Minimal spurring off the epicondyles may reflect stigmata of bilateral old epicondylitis. Electronically Signed   By: Ashley Royalty M.D.   On: 02/07/2017 13:31   Dg Wrist Complete Right  Result Date: 02/07/2017 CLINICAL DATA:  Pain around the first and second metacarpals with swelling and bruising after fall last evening. EXAM: RIGHT WRIST - COMPLETE 3+ VIEW COMPARISON:  None. FINDINGS: There is no evidence of fracture or dislocation. There is no evidence of arthropathy or other focal bone abnormality. Mild dorsal soft tissue swelling is seen overlying the hand and wrist. IMPRESSION: Dorsal soft tissue swelling of the hand and wrist. No acute fracture nor dislocations noted. Electronically Signed   By: Ashley Royalty M.D.   On: 02/07/2017 13:32   Dg Hand Complete Right  Result Date: 02/07/2017 CLINICAL DATA:  First and second metacarpal pain pain after fall last evening. EXAM: RIGHT HAND - COMPLETE 3+ VIEW COMPARISON:  None. FINDINGS: There is no evidence of fracture or dislocation. There is no evidence of arthropathy or other focal bone abnormality. Mild soft tissue induration over the dorsum of the hand. IMPRESSION: Soft tissue swelling over the dorsum of the hand. No fracture or dislocations. Electronically Signed   By: Ashley Royalty M.D.   On: 02/07/2017 13:33     MDM   1. Injury of right hand, initial encounter   2. Right arm pain   3. Fall from slip, trip, or stumble, initial encounter    Pt c/o Right hand and wrist pain that started last night after hitting it against wall due to trip and fall.  Plain films: Negative for acute fracture or dislocation, however, due to severity of pain and swelling along radial aspect of hand, will place pt in thumb spica splint.   Pt is Right hand dominant. Pt can remove splint to bath but otherwise instructed to keep splint on, even when sleeping.  F/u with Sports Medicine in 2 days or early next week for recheck of symptoms and possibly repeat xray  Home care  instructions provided.     Noe Gens, [place] 02/07/17 1406

## 2017-02-12 ENCOUNTER — Ambulatory Visit (INDEPENDENT_AMBULATORY_CARE_PROVIDER_SITE_OTHER): Payer: Commercial Managed Care - PPO | Admitting: Family Medicine

## 2017-02-12 ENCOUNTER — Encounter: Payer: Self-pay | Admitting: Family Medicine

## 2017-02-12 VITALS — BP 123/79 | HR 88 | Wt 181.0 lb

## 2017-02-12 DIAGNOSIS — S60221A Contusion of right hand, initial encounter: Secondary | ICD-10-CM

## 2017-02-12 NOTE — Progress Notes (Signed)
   Subjective:    I'm seeing this patient as a consultation for:  Leeroy Cha PA-C  CC: right hand pain   HPI:   Leah Foley is a 56 year-old female who presents with right hand pain. She states that she fell on 02/07/2017 and struck her hand. She was seen at Urgent Care on the same day and then underwent hand, wrist, and elbow x-rays which are unremarkable. Since this time she has taken ibuprofen and tylenol which was controlled her pain well. She has also used aloe and comfry as topical agents on the hand, as well as icing and elevating it. She denies any numbness or tingling and states that the swelling has improved since the time of injury. She is currently wearing a thumb spica cast as she continues to recover. Today, the pain is mostly only associated with her thumb.   Past medical history, Surgical history, Family history not pertinant except as noted below, Social history, Allergies, and medications have been entered into the medical record, reviewed, and no changes needed.   Review of Systems: No headache, visual changes, nausea, vomiting, diarrhea, constipation, dizziness, abdominal pain, skin rash, fevers, chills, night sweats, weight loss, swollen lymph nodes, body aches, joint swelling, muscle aches, chest pain, shortness of breath, mood changes, visual or auditory hallucinations.   Objective:    Vitals:   02/12/17 1119  BP: 123/79  Pulse: 88   General: Well Developed, well nourished, and in no acute distress.  Neuro/Psych: Alert and oriented x3, extra-ocular muscles intact, able to move all 4 extremities, sensation grossly intact. Skin: Warm and dry, no rashes noted.  Respiratory: Not using accessory muscles, speaking in full sentences, trachea midline.  Cardiovascular: Pulses palpable, no extremity edema. Abdomen: Does not appear distended. MSK: Bilateral normal range of motion in elbow and wrists. Decreased range of motion of thumb. Bruising on dorsal surface of R hand and  tenderness of anatomic snuffbox.  Study Result   CLINICAL DATA:  Pain around the first and second metacarpals with swelling and bruising after fall last evening.  EXAM: RIGHT WRIST - COMPLETE 3+ VIEW  COMPARISON:  None.  FINDINGS: There is no evidence of fracture or dislocation. There is no evidence of arthropathy or other focal bone abnormality. Mild dorsal soft tissue swelling is seen overlying the hand and wrist.  IMPRESSION: Dorsal soft tissue swelling of the hand and wrist. No acute fracture nor dislocations noted.   Electronically Signed   By: Ashley Royalty M.D.   On: 02/07/2017 13:32     Impression and Recommendations:    Assessment and Plan: 56 y.o. female with a 5 day history of right hand pain. X-rays taken on the date of injury were unremarkable. A fracture of the scaphoid is possible; however, it is most likely that the patient has a severely bruised R hand. Patient should continue to wear thumb spica brace and follow-up in clinic next week. Hand/wrist x-rays to further evaluate for scaphoid fracture will be performed at this time and the need for PT will also be discussed.    No orders of the defined types were placed in this encounter.  Meds ordered this encounter  Medications  . ibuprofen (ADVIL,MOTRIN) 200 MG tablet    Sig: Take 200 mg by mouth every 6 (six) hours as needed.    Discussed warning signs or symptoms. Please see discharge instructions. Patient expresses understanding.

## 2017-02-12 NOTE — Patient Instructions (Signed)
Thank you for coming in today. Continue the splint.  Use ice as needed.  Continue tylenol for pain control.  Recheck in 7-10 days.    Scaphoid Fracture A scaphoid fracture is a break in one of the small bones of the wrist. The scaphoid bone is located on the thumbside of the wrist. Itsupports the other seven bones that make up the wrist. The scaphoid bone has a poor blood supply, so it can take a long time to heal. You may need to wear a cast or splint for several months. What are the causes? This injury is usually caused by a fall onto an outstretched hand and arm. This type of injury may also occur if you are in a motor vehicle collisionand you brace yourself with your hand. What increases the risk? The following factors may make you more likely to develop this injury:  Playingcontact sports.  Skiing, skating, or rollerblading.  What are the signs or symptoms? Symptoms of this injury include:  Pain, especially when grasping or pinching with your thumb.  Pain when pressing on the base of your thumb, especially in the hollow area at the base of your thumb when your thumb is extended outward.  Swelling.  Bruising.  How is this diagnosed? This injury may be diagnosed based on:  Your history of injury.  A physical exam of your wrist and thumb.  X-rays.  CT scan or MRI. These tests are sometimes needed because this type of fracture may not show up on X-rays.  A scaphoid fracture may be hard to diagnose because pain may not start for a few days. Also, the fracture does not cause a deformity, and it may not limit movement. How is this treated? Treatment depends on the location of the fracture and whether the bone is out of place (displaced). Treatment may be surgical or nonsurgical:  You may need a cast or splint from the middle of your forearm down to your wrist. Yourthumb may be extended out and included in the cast or splint.  While your fracture is healing, it may be  treated with sound waves or electricalenergy to stimulate healing.  A displaced fracture may require surgery to put the pieces of bone back in proper position. Screws or wires may be used to hold the bone in place.  You may need to do exercises (physical therapy) to restore wrist movement after your cast or splint is removed.  Follow these instructions at home: If you have a cast:  Do not stick anything inside the cast to scratch your skin. Doing that increases your risk of infection.  Check the skin around the cast every day. Report any concerns to your health care provider. You may put lotion on dry skin around the edges of the cast. Do not apply lotion to the skin underneath the cast.  Do not let your cast get wet if it is not waterproof.  Keep the cast clean. If you have a splint:  Wear the splint as told by your health care provider. Remove it only as told by your health care provider.  Loosen the splint if your fingers tingle, become numb, or turn cold and blue.  Do not let your splint get wet if it is not waterproof.  Keep the splint clean. Bathing  Do not take baths, swim, or use a hot tub until your health care provider approves. Ask your health care provider if you can take showers. You may only be allowed to take sponge  baths for bathing.  If your cast or splint is not waterproof, cover it with a watertight plastic bag when you take a bath or a shower. Managing pain, stiffness, and swelling  If directed, apply ice to the injured area. ? Put ice in a plastic bag. ? Place a towel between your skin and the bag. ? Leave the ice on for 20 minutes, 2-3 times per day.  Move your fingers often to avoid stiffness and to lessen swelling.  Raise (elevate) the injured area above the level of your heart while you are sitting or lying down. Driving  Do not drive or operate heavy machinery while taking prescription pain medicine.  Ask your health care provider when it is safe  to drive if you have a cast or splint on a hand that you use for driving. Activity  Return to your normal activities as told by your health care provider. Ask your health care provider what activities are safe for you. You may need to limit activities such as contact sports, throwing, pushing, climbing, and usingvibrating machinery.  Do not lift anything that is heavier than 1 lb (0.5 kg) with the affected hand until your health care provider tells you that it is safe.  Do exercises only as told by your health care provider. General instructions  Do not put pressure on any part of the cast or splint until it is fully hardened. This may take several hours.  Take over-the-counter and prescription medicines only as told by your health care provider.  Do not use any tobacco products, including cigarettes, chewing tobacco, or e-cigarettes. Tobacco can delay bone healing. If you need help quitting, ask your health care provider.  Keep all follow-up visits as told by your health care provider. This is important. Contact a health care provider if:  Your pain or swelling gets worse even though you have had treatment.  You have pain, numbness, or coldness in your hand or fingers.  Your cast or splint becomes loose or damaged. Get help right away if:  You lose feeling in your hand or fingers.  Your fingers or fingernails turn pale or blue. This information is not intended to replace advice given to you by your health care provider. Make sure you discuss any questions you have with your health care provider. Document Released: 07/14/2002 Document Revised: 12/30/2015 Document Reviewed: 02/03/2015 Elsevier Interactive Patient Education  [employer].

## 2017-02-19 ENCOUNTER — Ambulatory Visit: Payer: Commercial Managed Care - PPO | Admitting: Family Medicine

## 2017-02-21 ENCOUNTER — Ambulatory Visit (INDEPENDENT_AMBULATORY_CARE_PROVIDER_SITE_OTHER): Payer: Commercial Managed Care - PPO | Admitting: Family Medicine

## 2017-02-21 ENCOUNTER — Encounter: Payer: Self-pay | Admitting: Family Medicine

## 2017-02-21 ENCOUNTER — Ambulatory Visit (INDEPENDENT_AMBULATORY_CARE_PROVIDER_SITE_OTHER): Payer: Commercial Managed Care - PPO

## 2017-02-21 VITALS — BP 115/75 | HR 90 | Ht 66.0 in | Wt 178.0 lb

## 2017-02-21 DIAGNOSIS — S6991XA Unspecified injury of right wrist, hand and finger(s), initial encounter: Secondary | ICD-10-CM

## 2017-02-21 DIAGNOSIS — S6991XD Unspecified injury of right wrist, hand and finger(s), subsequent encounter: Secondary | ICD-10-CM

## 2017-02-21 DIAGNOSIS — W19XXXD Unspecified fall, subsequent encounter: Secondary | ICD-10-CM | POA: Diagnosis not present

## 2017-02-21 NOTE — Progress Notes (Signed)
   Leah Foley is a 56 y.o. female who presents to [facility] today for follow-up right wrist pain. Patient was seen a few weeks ago for right hand injury. She suffered a contusion. She continued to have pain in the anatomical snuff box after x-rays were unremarkable. Therefore she was asked to use a thumb spica splint. In the interim she is continue the splint off feels better. She notes continued mild pain in the snuffbox and continued pain at the first MCP.   Past Medical History:  Diagnosis Date  . Allergy    Past Surgical History:  Procedure Laterality Date  . ABDOMINAL HYSTERECTOMY    . APPENDECTOMY     Social History  Substance Use Topics  . Smoking status: Never Smoker  . Smokeless tobacco: Never Used  . Alcohol use No     ROS:  As above   Medications: Current Outpatient Prescriptions  Medication Sig Dispense Refill  . ibuprofen (ADVIL,MOTRIN) 200 MG tablet Take 200 mg by mouth every 6 (six) hours as needed.    . Multiple Vitamin (MULTIVITAMIN) tablet Take 1 tablet by mouth daily.     No current facility-administered medications for this visit.    Allergies  Allergen Reactions  . Azithromycin Palpitations  . Sulfa Antibiotics Rash     Exam:  BP 115/75   Pulse 90   Ht 5\' 6"  (1.676 m)   Wt 178 lb (80.7 kg)   BMI 28.73 kg/m  General: Well Developed, well nourished, and in no acute distress.  Neuro/Psych: Alert and oriented x3, extra-ocular muscles intact, able to move all 4 extremities, sensation grossly intact. Skin: Warm and dry, no rashes noted.  Respiratory: Not using accessory muscles, speaking in full sentences, trachea midline.  Cardiovascular: Pulses palpable, no extremity edema. Abdomen: Does not appear distended. MSK: Right hand: Slightly swollen and bruised along the second and third MCPs. Normal motion. Tender palpation first MCP and mildly tender at the snuffbox. No laxity with stress of the  ulnar collateral ligament at first MCP Cap refill and sensation intact.  X-ray right wrist: No obvious fracture. Awaiting formal radiology review   No results found for this or any previous visit (from the past 48 hour(s)). No results found.    Assessment and Plan: 56 y.o. female with hand contusion. Doubtful for significant injury. Plan for relative rest and resuming normal activities. If not better will start in physical therapy. Recheck if needed.    Orders Placed This Encounter  Procedures  . DG Wrist Complete Right    Standing Status:   Future    Number of Occurrences:   1    Standing Expiration Date:   04/24/2018    Order Specific Question:   Reason for Exam (SYMPTOM  OR DIAGNOSIS REQUIRED)    Answer:   include 1st MCP. Eval pain scaphoid and 2nd MCP    Order Specific Question:   Is patient pregnant?    Answer:   No    Order Specific Question:   Preferred imaging location?    Answer:   [facility]    Order Specific Question:   Radiology Contrast Protocol - do NOT remove file path    Answer:   [email or web address]   No orders of the defined types were placed in this encounter.   Discussed warning signs or symptoms. Please see discharge instructions. Patient expresses understanding.

## 2017-02-21 NOTE — Patient Instructions (Signed)
Thank you for coming in today. Continue normal activities with your hand.  Recheck if not better. Let me know if you are not getting better in a few weeks and we will start hand PT.  We may consider a fancy MRI in the future if not better.    Ulnar Collateral Ligament Injury of the Thumb A ligament is a strong band of tissue that connects and supports bones. Ulnar collateral ligament (UCL) injury happens when the UCL at the base of the thumb is stretched or torn. A tear can be either partial or complete. The severity of the injury depends on how much of the ligament was damaged or torn. The UCL ligament is important for normal use of the thumb. This ligament helps you to use and move your thumb. UCL injury can happen suddenly (acuteinjury) or gradually (chronic injury) with repeated overstretching of the ligament. If it is not treated properly, UCL injury can lead to arthritis. What are the causes? This injury is caused by forcefully moving the thumb past its normal range of motion toward the wrist. If you extend your hands to catch an object or to protect yourself while falling, the force of the impact can cause your ligament to stretch too much. This excess tension can also cause your ligament to tear. What increases the risk? This injury is more likely to occur in:  People who have had a previous thumb injury or sprain.  People who play contact sports or sports that involve catching balls, such as baseball, basketball, or football.  People who do activities that increase the chance that the thumb will be pulled away from the rest of the hand.  People who have poor hand strength and flexibility.  People who do not warm up properly before activities.  What are the signs or symptoms? Symptoms of this injury include:  Pain or tenderness over the injured area with movement of the thumb.  Pain when the injured area is pressed.  Bruising or redness at the base of the thumb. This can  spread to the whole thumb and part of the hand.  Swelling over the injured area.  Difficulty grasping or pinching with the injured thumb due to weakness or pain.  If the injury is severe, a lump (mass) may be felt under the skin in the injured area. How is this diagnosed? This injury is diagnosed with a medical history and physical exam. You may also have imaging studies, including:  X-ray.  Ultrasound.  MRI.  How is this treated? Treatment varies depending on the severity of your injury. If the UCL is overstretched or partially torn, treatment usually involves keeping your thumb in a fixed position (immobilization) for a period of time. To help you do this, your health care provider will apply a brace, cast, or splint to keep your thumb from moving until it heals. If the UCL is fully torn, you may need surgery to reconnect the ligament to the bone. After surgery, a cast or splint will be applied and it will need to stay on your thumb while it heals. Your health care provider may also suggest exercises or physical therapy to strengthen your thumb. Follow these instructions at home: If you have a cast:  Do not stick anything inside the cast to scratch your skin. Doing that increases your risk of infection.  Check the skin around the cast every day. Report any concerns to your health care provider. You may put lotion on dry skin around the  edges of the cast. Do not apply lotion to the skin underneath the cast.  Keep the cast clean and dry. If you have a splint or brace:  Wear it as told by your health care provider. Remove it only as told by your health care provider.  Loosen it if your fingers become numb and tingle, or if they turn cold and blue.  Keep the brace or splint clean and dry. Bathing  Cover the cast or splint and bandage (dressing) with a watertight plastic bag to protect it from water while you take a bath or shower. Do not let the cast or splint and dressing get  wet. Managing pain, stiffness, and swelling  If directed, apply ice to the injured area: ? Put ice in a plastic bag. ? Place a towel between your skin and the bag. ? Leave the ice on for 20 minutes, 2-3 times per day.  Move your fingers often to avoid stiffness and to lessen swelling.  Raise (elevate) the injured area above the level of your heart while you are sitting or lying down. Driving  Do not drive or operate heavy machinery while taking prescription pain medicine.  Ask your health care provider when it is safe to drive if you have a cast, splint, or brace on your hand. General instructions  Do not put pressure on any part of your cast or splint until it is fully hardened. This may take several hours.  Take over-the-counter and prescription medicines only as told by your health care provider.  Keep all follow-up visits as told by your health care provider. This is important.  Do not wear rings on your injured thumb.  Do any exercise or physical therapy as told by your health care provider. Contact a health care provider if:  Your pain is not controlled with medicine.  Your bruising or swelling gets worse.  Your cast or splint is damaged.  Your thumb is numb or blue.  Your thumb feels colder than normal. This information is not intended to replace advice given to you by your health care provider. Make sure you discuss any questions you have with your health care provider. Document Released: 07/24/2005 Document Revised: 03/26/2016 Document Reviewed: 09/30/2014 Elsevier Interactive Patient Education  [employer].

## 2017-03-07 ENCOUNTER — Ambulatory Visit (INDEPENDENT_AMBULATORY_CARE_PROVIDER_SITE_OTHER): Payer: Commercial Managed Care - PPO | Admitting: Osteopathic Medicine

## 2017-03-07 ENCOUNTER — Encounter: Payer: Self-pay | Admitting: Osteopathic Medicine

## 2017-03-07 VITALS — BP 113/78 | HR 79 | Ht 66.0 in | Wt 181.0 lb

## 2017-03-07 DIAGNOSIS — D1722 Benign lipomatous neoplasm of skin and subcutaneous tissue of left arm: Secondary | ICD-10-CM

## 2017-03-07 DIAGNOSIS — Z0189 Encounter for other specified special examinations: Secondary | ICD-10-CM | POA: Diagnosis not present

## 2017-03-07 DIAGNOSIS — R0789 Other chest pain: Secondary | ICD-10-CM

## 2017-03-07 DIAGNOSIS — Z008 Encounter for other general examination: Secondary | ICD-10-CM

## 2017-03-07 NOTE — Progress Notes (Signed)
HPI: Leah Foley is a 56 y.o. female  who presents to [facility] today, 03/07/17,  for chief complaint of:  Chief Complaint  Patient presents with  . Other    Biometric Screening   . Mass   Biometric screening - patient has form with her today.   Skin lesion  . Context: Would like to talk about possibly getting these looked at/removed . Location: Left arm, left lower back . Quality: Lumps under skin, occasionally tender, one on left arm may have gotten a bit bigger over the course the past year . Duration: Has them present several years without much change   Past medical, surgical, social and family history reviewed: Patient Active Problem List   Diagnosis Date Noted  . Acute non-recurrent ethmoidal sinusitis 07/21/2016  . Single skin nodule 06/06/2016  . Left knee pain 11/08/2015  . Difficulty sleeping 09/22/2013  . Need for prophylactic vaccination with combined diphtheria-tetanus-pertussis (DTP) vaccine 05/03/2013  . Other and unspecified hyperlipidemia 05/03/2013  . Allergic rhinitis 03/18/2013  . Routine general medical examination at a health care facility 03/18/2013  . Lipoma 03/18/2013   Past Surgical History:  Procedure Laterality Date  . ABDOMINAL HYSTERECTOMY    . APPENDECTOMY     Social History  Substance Use Topics  . Smoking status: Never Smoker  . Smokeless tobacco: Never Used  . Alcohol use No   Family History  Problem Relation Age of Onset  . Lupus Mother   . Cancer Father   . Kidney disease Maternal Grandfather      Current medication list and allergy/intolerance information reviewed:   Current Outpatient Prescriptions  Medication Sig Dispense Refill  . ibuprofen (ADVIL,MOTRIN) 200 MG tablet Take 200 mg by mouth every 6 (six) hours as needed.    . Multiple Vitamin (MULTIVITAMIN) tablet Take 1 tablet by mouth daily.     No current facility-administered medications for this visit.    Allergies  Allergen  Reactions  . Azithromycin Palpitations  . Sulfa Antibiotics Rash      Review of Systems:  Constitutional:  No  fever, no chills, No recent illness, +unintentional weight gain. No significant fatigue.   HEENT: No  headache, no vision change  Cardiac: No  chest pain, No palpitations but occasional twinging type discomfort ongoing many months, No  Orthopnea  Respiratory:  No  shortness of breath.   Musculoskeletal: No new myalgia/arthralgia  Genitourinary: No  incontinence, No  abnormal genital bleeding, No abnormal genital discharge  Skin: No  Rash, lumps as per HPI   Neurologic: No  weakness, No  dizziness  Exam:  BP 113/78   Pulse 79   Ht 5\' 6"  (1.676 m)   Wt 181 lb (82.1 kg)   BMI 29.21 kg/m   Constitutional: VS see above. General Appearance: alert, well-developed, well-nourished, NAD  Eyes: Normal lids and conjunctive, non-icteric sclera  Ears, Nose, Mouth, Throat: MMM, Normal external inspection ears/nares/mouth/lips/gums.   Neck: No masses, trachea midline. No thyroid enlargement  Respiratory: Normal respiratory effort. no wheeze, no rhonchi, no rales  Cardiovascular: S1/S2 normal, no murmur, no rub/gallop auscultated. RRR. No lower extremity edema..  Musculoskeletal: Gait normal. No clubbing/cyanosis of digits.    Neurological: Normal balance/coordination. No tremor.  Skin: warm, dry, intact. No rash/ulcer. subcutaneous mass on left forearm 2, one consistent with lipoma, one uncertain whether lipoma versus cyst. Lipoma-type lesion on left lower back as well.  Psychiatric: Normal judgment/insight. Normal mood and affect. Oriented x3.  ASSESSMENT/PLAN:   Encounter for biometric screening - Plan: Glucose, Lipid panel  Lipoma of left upper extremity - Plan: Ambulatory referral to General Surgery  Chest discomfort - Brings this up at very end of visit. Sounds like PVC, chronic issue and no alarm signs, pt advised scheudle follow-up with PCP and ER/RTC  precautions reviewed.       Visit summary with medication list and pertinent instructions was printed for patient to review. All questions at time of visit were answered - patient instructed to contact office with any additional concerns. ER/RTC precautions were reviewed with the patient. Follow-up plan: Return for annual physical with Luvenia Starch when due and separate appointement to discuss chronic issue.

## 2017-03-08 LAB — LIPID PANEL
Cholesterol: 259 mg/dL — ABNORMAL HIGH (ref <200)
HDL: 76 mg/dL (ref >50)
LDL CALC: 144 mg/dL — AB (ref <100)
TRIGLYCERIDES: 197 mg/dL — AB (ref <150)
Total CHOL/HDL Ratio: 3.4 Ratio (ref <5.0)
VLDL: 39 mg/dL — AB (ref <30)

## 2017-03-08 LAB — GLUCOSE, RANDOM: Glucose, Bld: 89 mg/dL (ref 65–99)

## 2017-09-26 ENCOUNTER — Ambulatory Visit: Payer: Commercial Managed Care - PPO | Admitting: Sports Medicine

## 2017-09-28 ENCOUNTER — Encounter: Payer: Self-pay | Admitting: Physician Assistant

## 2017-09-28 ENCOUNTER — Ambulatory Visit: Payer: Commercial Managed Care - PPO | Admitting: Physician Assistant

## 2017-09-28 VITALS — BP 126/68 | HR 84 | Ht 66.0 in | Wt 175.0 lb

## 2017-09-28 DIAGNOSIS — M79674 Pain in right toe(s): Secondary | ICD-10-CM

## 2017-09-28 DIAGNOSIS — M25571 Pain in right ankle and joints of right foot: Secondary | ICD-10-CM

## 2017-09-28 NOTE — Patient Instructions (Signed)
When ankle is bothering you: consider ice, ibuprofen, rest. Consider a good supportive shoe around house and outside.   Ankle support brace for when working in yard.

## 2017-09-28 NOTE — Progress Notes (Signed)
   Subjective:    Patient ID: Leah Foley, female    DOB: 1961-05-06, 57 y.o.   MRN: [id number]  HPI Pt is a 57 yo female who presents to the clinic to discuss right 4th and 5th toe pain and right ankle pain. Pain started after wearing her gardening shoes and working the yard about 2 weeks ago. Toe pain has resolved. Her ankle continues to intermittently hurt on the lateral side. She has not done anything to make better. No known injury.   .. Active Ambulatory Problems    Diagnosis Date Noted  . Allergic rhinitis 03/18/2013  . Routine general medical examination at a health care facility 03/18/2013  . Lipoma 03/18/2013  . Need for prophylactic vaccination with combined diphtheria-tetanus-pertussis (DTP) vaccine 05/03/2013  . Other and unspecified hyperlipidemia 05/03/2013  . Difficulty sleeping 09/22/2013  . Left knee pain 11/08/2015  . Single skin nodule 06/06/2016  . Acute non-recurrent ethmoidal sinusitis 07/21/2016   Resolved Ambulatory Problems    Diagnosis Date Noted  . No Resolved Ambulatory Problems   Past Medical History:  Diagnosis Date  . Allergy       Review of Systems  All other systems reviewed and are negative.      Objective:   Physical Exam  Constitutional: She is oriented to person, place, and time. She appears well-developed and well-nourished.  HENT:  Head: Normocephalic and atraumatic.  Cardiovascular: Normal rate, regular rhythm and normal heart sounds.  Musculoskeletal:  No tenderness to palpation over toes of right foot. Callus like nodule on DIP of 5th right toe.  No pain over ball of feet or plantar fascia.   Normal ROM of right ankle. No tenderness to palpation over lateral or medial malleolar.  Strength of ankle 5/5.  No swelling or bruising.   Neurological: She is alert and oriented to person, place, and time.  Psychiatric: She has a normal mood and affect. Her behavior is normal.          Assessment & Plan:  Marland KitchenMarland KitchenDiagnoses and all  orders for this visit:  Acute right ankle pain  Pain in right toe(s)   Reassured patient today that exam was reassuring. Pain in toes likely from wearing shoes that are too tight. Switch up shoes. She may have some ankle weakness and/or arthritis. Discussed NSAID as needed. Wearing an ankle stabilizing brace when gardening or being active. Given ankle exercises for stabilization. Follow up as needed.

## 2017-09-30 ENCOUNTER — Encounter: Payer: Self-pay | Admitting: Physician Assistant

## 2017-11-28 ENCOUNTER — Ambulatory Visit: Payer: Commercial Managed Care - PPO | Admitting: Family Medicine

## 2017-11-29 ENCOUNTER — Ambulatory Visit (INDEPENDENT_AMBULATORY_CARE_PROVIDER_SITE_OTHER): Payer: Commercial Managed Care - PPO

## 2017-11-29 ENCOUNTER — Ambulatory Visit: Payer: Commercial Managed Care - PPO | Admitting: Family Medicine

## 2017-11-29 ENCOUNTER — Encounter: Payer: Self-pay | Admitting: Family Medicine

## 2017-11-29 VITALS — BP 106/71 | HR 81 | Ht 65.98 in | Wt 176.0 lb

## 2017-11-29 DIAGNOSIS — M25521 Pain in right elbow: Secondary | ICD-10-CM

## 2017-11-29 DIAGNOSIS — M7521 Bicipital tendinitis, right shoulder: Secondary | ICD-10-CM

## 2017-11-29 MED ORDER — DICLOFENAC SODIUM 1 % TD GEL: 2.0000 g | Freq: Four times a day (QID) | TRANSDERMAL | 11 refills | Status: DC

## 2017-11-29 NOTE — Patient Instructions (Addendum)
Thank you for coming in today. Apply the diclofenac gel 4x daily for pain as needed.  Do the home exercises focused on starting with the elbow bent and letting it extend slowly.  Use a several pound hand weight or elastic band 15-30 reps 2-3x daily.  Elbow compression sleeve may help.  Body helix full elbow is pretty good.  Do not sleep in it.  Attend Physical Therapy.  Recheck with me in 6 weeks.  Return sooner if not doing well.    Biceps Tendon Tendinitis (Distal) Rehab Ask your health care provider which exercises are safe for you. Do exercises exactly as told by your health care provider and adjust them as directed. It is normal to feel mild stretching, pulling, tightness, or discomfort as you do these exercises, but you should stop right away if you feel sudden pain or your pain gets worse.Do not begin these exercises until told by your health care provider. Stretching and range of motion exercises These exercises warm up your muscles and joints and improve the movement and flexibility of your arm. These exercises can also help to relieve pain and stiffness. Exercise A: Supination, active-assisted 1. Stand or sit with your left / right elbow bent to an "L" shape (90 degrees). 2. Rotate your palm up until you cannot rotate it anymore. Then, use your other hand to help rotate your left / right forearm more. 3. Hold this position for __________ seconds. 4. Slowly return to the starting position. Repeat __________ times. Complete this exercise __________ times a day. Exercise B: Pronation, active-assisted  1. Stand or sit with your left / right elbow bent to an "L" shape (90 degrees). 2. Rotate your left / right palm down until you cannot rotate it anymore. Then, use your other hand to help rotate your left / right forearm more. 3. Hold this position for __________ seconds. 4. Slowly return to the starting position. Repeat __________ times. Complete this exercise __________ times a  day. Strengthening exercises These exercises build strength and endurance in your arm and shoulder. Endurance is the ability to use your muscles for a long time, even after your muscles get tired. Exercise C: Supination  1. Sit with your left / right forearm supported on a table. Your elbow should be at waist height. 2. Rest your hand over the edge of the table, palm-down. 3. Gently grasp a lightweight hammer near the head. As this exercise gets easier for you, try holding the hammer farther down the handle. 4. Without moving your elbow, slowly rotate your palm up so it faces the ceiling. 5. Hold this position for__________ seconds. 6. Slowly return to the starting position. Repeat __________ times. Complete this exercise __________ times a day. Exercise D: Pronation  1. Sit with your left / right forearm supported on a table. Your elbow should be at waist height. 2. Rest your hand over the edge of the table, palm-up. 3. Gently grasp a lightweight hammer near the head. As this exercise gets easier for you, try holding the hammer farther down the handle. 4. Without moving your elbow, slowly rotate your palm down. 5. Hold this position for __________ seconds. 6. Slowly return to the starting position. Repeat __________ times. Complete this exercise __________ times a day. Exercise E: Elbow flexion, supinated  1. Sit on a stable chair without armrests, or stand. 2. If directed, hold a __________ weight in your left / right hand, or hold an exercise band with both hands. Your palms should face  up toward the ceiling at the starting position. 3. Bend your left / right elbow and move your hand up toward your shoulder. Keep your other arm straight down, in the starting position. 4. Slowly return to the starting position. Repeat __________ times. Complete this exercise __________ times a day. Exercise F: Elbow extension  1. Lie on your back. 2. Hold a __________ weight in your left / right  hand. 3. Bend your left / right elbow to an "L" shape (90 degrees) so your elbow is pointed up to the ceiling and the weight is overhead. 4. Straighten your elbow, raising your hand toward the ceiling. Use your other hand to support your left / right upper arm and to keep it still. 5. Slowly return to the starting position. Repeat __________ times. Complete this exercise __________ times a day. This information is not intended to replace advice given to you by your health care provider. Make sure you discuss any questions you have with your health care provider. Document Released: 07/24/2005 Document Revised: 03/30/2016 Document Reviewed: 07/02/2015 Elsevier Interactive Patient Education  [employer].

## 2017-11-29 NOTE — Progress Notes (Signed)
Leah Foley is a 57 y.o. female who presents to [facility] today for right elbow pain.  Leah Foley was in her normal state of health 2 weeks ago when she fell landing on her right knee and right arm.  She notes moderate pain in her anterior elbow.  She notes the pain is worse with elbow flexion.  She does not think she has much pain with pronation or supination of her forearm.  She notes like sweeping picking and pushing and pulling are painful.  She has used ibuprofen which does help.  She is used a brace which helps as well.  She notes some pain radiating to her right shoulder and some mild hand swelling as well.  She denies any fevers or chills nausea vomiting or diarrhea.  She denies any weakness or numbness extending to her hand.    Past Medical History:  Diagnosis Date  . Allergy    Past Surgical History:  Procedure Laterality Date  . ABDOMINAL HYSTERECTOMY    . APPENDECTOMY     Social History   Tobacco Use  . Smoking status: Never Smoker  . Smokeless tobacco: Never Used  Substance Use Topics  . Alcohol use: No     ROS:  As above   Medications: Current Outpatient Medications  Medication Sig Dispense Refill  . ibuprofen (ADVIL,MOTRIN) 200 MG tablet Take 200 mg by mouth every 6 (six) hours as needed.    . Multiple Vitamin (MULTIVITAMIN) tablet Take 1 tablet by mouth daily.    . diclofenac sodium (VOLTAREN) 1 % GEL Apply 2 g topically 4 (four) times daily. To affected joint. 100 g 11   No current facility-administered medications for this visit.    Allergies  Allergen Reactions  . Azithromycin Palpitations  . Sulfa Antibiotics Rash     Exam:  BP 106/71   Pulse 81   Ht 5' 5.98" (1.676 m)   Wt 176 lb (79.8 kg)   SpO2 96%   BMI 28.42 kg/m  General: Well Developed, well nourished, and in no acute distress.  Neuro/Psych: Alert and oriented x3, extra-ocular muscles intact, able to move all 4 extremities, sensation grossly  intact. Skin: Warm and dry, no rashes noted.  Respiratory: Not using accessory muscles, speaking in full sentences, trachea midline.  Cardiovascular: Pulses palpable, no extremity edema. Abdomen: Does not appear distended. MSK:  Right shoulder normal-appearing nontender normal motion normal strength.  Hawkins and Neer's test. Right elbow normal-appearing without any significant swelling or ecchymosis or effusion. Tender to palpation in the proximal forearm along the insertion of the biceps tendon. Normal hook test of the distal biceps tendon. Range of motion of the elbow is intact full extension and supination however patient is mildly painful with full extension and full supination. She has excellent strength to flexion extension pronation and supination. She is tender with resisted flexion from 10 degrees to about 40 degrees.  She is not tender resisted supination.    Pulses capillary refill and sensation are intact distally.  X-ray right elbow.  My independent review of the following images. Mild degenerative changes.  No acute findings. Awaiting formal radiology review    Assessment and Plan: 57 y.o. female with right elbow pain.  Symptoms are more consistent with biceps tendinitis.  No obvious fracture on today's x-ray.  Plan for physical therapy diclofenac gel and compressive elbow sleeve.  Recheck in about 6 weeks.  Return sooner if needed.    Orders Placed This Encounter  Procedures  . DG Elbow Complete Right    Standing Status:   Future    Number of Occurrences:   1    Standing Expiration Date:   01/30/2019    Order Specific Question:   Reason for Exam (SYMPTOM  OR DIAGNOSIS REQUIRED)    Answer:   eval anterior elbow pain after fall. ?radial head    Order Specific Question:   Is patient pregnant?    Answer:   No    Order Specific Question:   Preferred imaging location?    Answer:   [facility]    Order Specific Question:   Radiology Contrast Protocol - do NOT  remove file path    Answer:   [email or web address]  . Ambulatory referral to Physical Therapy    Referral Priority:   Routine    Referral Type:   Physical Medicine    Referral Reason:   Specialty Services Required    Requested Specialty:   Physical Therapy   Meds ordered this encounter  Medications  . diclofenac sodium (VOLTAREN) 1 % GEL    Sig: Apply 2 g topically 4 (four) times daily. To affected joint.    Dispense:  100 g    Refill:  11    Discussed warning signs or symptoms. Please see discharge instructions. Patient expresses understanding.

## 2017-12-05 ENCOUNTER — Ambulatory Visit: Payer: Commercial Managed Care - PPO | Admitting: Physical Therapy

## 2017-12-05 ENCOUNTER — Encounter: Payer: Self-pay | Admitting: Physical Therapy

## 2017-12-05 DIAGNOSIS — M6281 Muscle weakness (generalized): Secondary | ICD-10-CM

## 2017-12-05 DIAGNOSIS — M25611 Stiffness of right shoulder, not elsewhere classified: Secondary | ICD-10-CM | POA: Diagnosis not present

## 2017-12-05 DIAGNOSIS — M79601 Pain in right arm: Secondary | ICD-10-CM

## 2017-12-05 NOTE — Patient Instructions (Addendum)
Scapular Retraction (Standing)    With arms at sides, pinch shoulder blades together. Hold for 5 sec. Repeat __10__ times per set. Do _1___ sets per session. Do __2__ sessions per day.  Wrist Extensor Stretch - keep hand lower than shoulder    Keeping elbow straight, grasp right hand and slowly bend wrist forward until stretch is felt. Hold __20__ seconds. Relax. Repeat __1__ times per set. Do __1__ sets per session. Do _2___ sessions per day.   Wrist Flexor Stretch - keep hand below shoulder    Keeping elbow straight, grasp right hand and slowly bend wrist back until stretch is felt. Hold __20__ seconds. Relax. repeat __1__ times per set. Do __1__ sets per session. Do __2__ sessions per day.  Towel Roll Squeeze or use a ball    With right forearm resting on surface, gently squeeze towel. Hold 5 sec Repeat __10__ times per set. Do _1___ sets per session. Do ___2_ sessions per day.   WORK ON:  Keeping shoulders back and blades drawn down.   Chest lifting.   Perfrom cross friction massage to the tender areas 3-5 min, then ice. Either perform an ice massage 3-5 min or ice pack 10-15 min. At least twice a day.    IONTOPHORESIS PATIENT PRECAUTIONS & CONTRAINDICATIONS:  dexamethasone  . Redness under one or both electrodes can occur.  This characterized by a uniform redness that usually disappears within 12 hours of treatment. . Small pinhead size blisters may result in response to the drug.  Contact your physician if the problem persists more than 24 hours. . On rare occasions, iontophoresis therapy can result in temporary skin reactions such as rash, inflammation, irritation or burns.  The skin reactions may be the result of individual sensitivity to the ionic solution used, the condition of the skin at the start of treatment, reaction to the materials in the electrodes, allergies or sensitivity to dexamethasone, or a poor connection between the patch and your skin.  Discontinue  using iontophoresis if you have any of these reactions and report to your therapist. . Remove the Patch or electrodes if you have any undue sensation of pain or burning during the treatment and report discomfort to your therapist. . Tell your Therapist if you have had known adverse reactions to the application of electrical current. . If using the Patch, the LED light will turn off when treatment is complete and the patch can be removed.  Approximate treatment time is 1-3 hours.  Remove the patch when light goes off or after 6 hours. . The Patch can be worn during normal activity, however excessive motion where the electrodes have been placed can cause poor contact between the skin and the electrode or uneven electrical current resulting in greater risk of skin irritation. Marland Kitchen Keep out of the reach of children.   . DO NOT use if you have a cardiac pacemaker or any other electrically sensitive implanted device. . DO NOT use if you have a known sensitivity to dexamethasone. . DO NOT use during Magnetic Resonance Imaging (MRI). . DO NOT use over broken or compromised skin (e.g. sunburn, cuts, or acne) due to the increased risk of skin reaction. . DO NOT SHAVE over the area to be treated:  To establish good contact between the Patch and the skin, excessive hair may be clipped. . DO NOT place the Patch or electrodes on or over your eyes, directly over your heart, or brain. . DO NOT reuse the Patch or electrodes as  this may cause burns to occur.

## 2017-12-05 NOTE — Therapy (Signed)
[facility] [place], [place], [address] Phone: [phone]   Fax:  [phone]  Physical Therapy Evaluation  Patient Details  Name: Leah Foley MRN: [id number] Date of Birth: [birth year] Referring Provider: Dr Lynne Leader   Encounter Date: 12/05/2017  PT End of Session - 12/05/17 0932    Visit Number  1    Number of Visits  6    Date for PT Re-Evaluation  01/16/18    PT Start Time  0932    PT Stop Time  1024    PT Time Calculation (min)  52 min       Past Medical History:  Diagnosis Date  . Allergy     Past Surgical History:  Procedure Laterality Date  . ABDOMINAL HYSTERECTOMY    . APPENDECTOMY      There were no vitals filed for this visit.   Subjective Assessment - 12/05/17 0933    Subjective  Pt reports she tripped over a stool about 3 wks ago, landing on arm and knee. Knee is better and elbow continues to hurt. 10 months ago injured Rt arm after tripping and smacking her arm on a wall- thought she had broke her hand, however it wasn't, took about 3 months for this to feel better. .      Diagnostic tests  x-rays (-) for fx    Patient Stated Goals  get rid of pain so she can not need medicine, get back to gardening without pain, strengthen the Rt UE         [facility] PT Assessment - 12/05/17 0001      Assessment   Medical Diagnosis  Rt elbow pain/bicep tendonitis    Referring Provider  Dr Lynne Leader    Onset Date/Surgical Date  11/14/17    Hand Dominance  Right    Next MD Visit  01/04/18 however patient wants to go earlier for an MRI    Prior Therapy  none      Precautions   Precautions  None    Required Braces or Orthoses  -- forearm compression brace      Balance Screen   Has the patient fallen in the past 6 months  Yes    How many times?  2    Has the patient had a decrease in activity level because of a fear of falling?   No    Is the patient reluctant to leave their home because of a  fear of falling?   No      Prior Function   Level of Independence  Independent using Lt arm for most now.     Leisure  gardening      Observation/Other Assessments   Focus on Therapeutic Outcomes (FOTO)   64% limited      Posture/Postural Control   Posture/Postural Control  Postural limitations    Postural Limitations  Forward head;Rounded Shoulders      ROM / Strength   AROM / PROM / Strength  AROM;Strength      AROM   AROM Assessment Site  Shoulder;Elbow;Forearm;Wrist    Right/Left Shoulder  Right    Right Shoulder Flexion  143 Degrees    Right Shoulder Internal Rotation  30 Degrees pain front of shoulder    Right Shoulder External Rotation  88 Degrees    Right/Left Elbow  Right    Right Elbow Flexion  114 with pain    Right Elbow Extension  0    Right/Left  Forearm  -- WNL    Right/Left Wrist  Right    Right Wrist Extension  45 Degrees    Right Wrist Flexion  48 Degrees    Right Wrist Radial Deviation  0 Degrees    Right Wrist Ulnar Deviation  31 Degrees      Strength   Strength Assessment Site  Shoulder;Elbow;Forearm;Wrist    Right/Left Shoulder  Right    Right Shoulder Flexion  4/5    Right Shoulder ABduction  4+/5    Right Shoulder External Rotation  4-/5 with pain     Right Shoulder Horizontal ABduction  4/5    Right/Left Elbow  Right    Right Elbow Flexion  4/5    Right Elbow Extension  4+/5    Right/Left Wrist  -- grip Rt 5/4/7, Lt 46/30/28      Palpation   Palpation comment  point tender in distal lateral bicep tendon, and proximal bicep long head with palpable edema. Trigger point in the Rt bicep                Objective measurements completed on examination: See above findings.      [facility] Adult PT Treatment/Exercise - 12/05/17 0001      Self-Care   Self-Care  Other Self-Care Comments    Other Self-Care Comments   instructed in cross friction massage and ice massage to Rt UE tender areas.       Exercises   Exercises  Shoulder       Shoulder Exercises: Seated   Retraction  Strengthening;Both;10 reps VC for form      Shoulder Exercises: Stretch   Other Shoulder Stretches  arm/forearm stretches per HEP with hand lower than shoulder             PT Education - 12/05/17 1207    Education provided  Yes    Education Details  HEP    Person(s) Educated  Patient    Methods  Explanation;Demonstration;Handout    Comprehension  Returned demonstration;Verbalized understanding          PT Long Term Goals - 12/05/17 1216      PT LONG TERM GOAL #1   Title  I with HEP ( 01/16/18)     Time  6    Period  Weeks    Status  New      PT LONG TERM GOAL #2   Title  demo Rt UE ROM equal to Lt without pain to allow her to resume using the Rt UE ( 01/16/18)     Time  6    Period  Weeks    Status  New      PT LONG TERM GOAL #3   Title  demo Rt UE strength =/> 4+/5 without pain to allow her to return to gardening ( 01/16/18)     Time  6    Period  Weeks    Status  New      PT LONG TERM GOAL #4   Title  improve FOTO =/< 38% limited ( 01/16/18)     Time  6    Period  Weeks    Status  New      PT LONG TERM GOAL #5   Title  report =/> 75% reduction in Rt UE pain with daily activity ( 01/16/18)     Time  6    Period  Weeks    Status  New  Plan - 12/05/17 1210    Clinical Impression Statement  57 yo female with ~ 3 wk h/o Rt arm pain that is getting progressively worse.  She hurt this same arm about 10 months ago and never had tx.  Her Rt UE ROM is limited due to pain as is her strength.  She is guarding and has switched to using her Lt UE for a lot of ADLs.  She also reports she does use the arm sometimes pushing through the pain.  She has palpable edema with tenderness in the Rt proximal and distal bicep tendons. PT is hesitiant to participate in therapy as she is not sure it will help and really felt like she needed an MRI.  A lot of time was spent on education about tendonitis, treatment protocols and  healing.  She is now open to trying PT     Clinical Presentation  Evolving    Clinical Decision Making  Low    Rehab Potential  Good    PT Frequency  1x / week    PT Duration  6 weeks    PT Treatment/Interventions  Iontophoresis 4mg /ml Dexamethasone;Dry needling;Manual techniques;Moist Heat;Ultrasound;Therapeutic activities;Patient/family education;Therapeutic exercise;Cryotherapy;Electrical Stimulation;Vasopneumatic Device    PT Next Visit Plan  [place] nonthermal to long head of Rt biceps, postural and RTC ex, Rt UE ROM,  she doesn't want ionto at this time.     Consulted and Agree with Plan of Care  Patient       Patient will benefit from skilled therapeutic intervention in order to improve the following deficits and impairments:  Pain, Postural dysfunction, Impaired UE functional use, Decreased strength, Decreased range of motion, Increased muscle spasms, Increased edema  Visit Diagnosis: Pain in right arm - Plan: PT plan of care cert/re-cert  Stiffness of right shoulder, not elsewhere classified - Plan: PT plan of care cert/re-cert  Muscle weakness (generalized) - Plan: PT plan of care cert/re-cert     Problem List Patient Active Problem List   Diagnosis Date Noted  . Acute non-recurrent ethmoidal sinusitis 07/21/2016  . Single skin nodule 06/06/2016  . Left knee pain 11/08/2015  . Difficulty sleeping 09/22/2013  . Need for prophylactic vaccination with combined diphtheria-tetanus-pertussis (DTP) vaccine 05/03/2013  . Other and unspecified hyperlipidemia 05/03/2013  . Allergic rhinitis 03/18/2013  . Routine general medical examination at a health care facility 03/18/2013  . Lipoma 03/18/2013    Jeral Pinch PT  12/05/2017, 12:22 PM  [facility] [place], [place], [address] Phone: [phone]   Fax:  [phone]  Name: Leah Foley MRN: [id number] Date of Birth: Aug 20, 1960

## 2017-12-10 ENCOUNTER — Ambulatory Visit: Payer: Commercial Managed Care - PPO | Admitting: Family Medicine

## 2017-12-10 ENCOUNTER — Ambulatory Visit: Payer: Commercial Managed Care - PPO | Admitting: Physician Assistant

## 2017-12-10 ENCOUNTER — Encounter: Payer: Self-pay | Admitting: Physician Assistant

## 2017-12-10 VITALS — BP 120/63 | HR 73 | Ht 66.0 in | Wt 176.0 lb

## 2017-12-10 DIAGNOSIS — R5383 Other fatigue: Secondary | ICD-10-CM | POA: Diagnosis not present

## 2017-12-10 DIAGNOSIS — H532 Diplopia: Secondary | ICD-10-CM

## 2017-12-10 NOTE — Progress Notes (Signed)
   Subjective:    Patient ID: Leah Foley, female    DOB: [birth year], 59 y.o.   MRN: [id number]  HPI  Pt is a 57 yo female who presents to the clinic with concerns of recent double vision. The first time was last Thursday while she was driving. She has had 2 or 3 episodes since and lasted up to 30 minutes. No new medications. She has been dx with dry eyes and not using drops but using allergy eye drops. No eye pain. Not had any episodes in a few days. She called eye doctor and has appt Wednesday.   Pt complains of lack of energy. She is sleeping well.   .. Active Ambulatory Problems    Diagnosis Date Noted  . Allergic rhinitis 03/18/2013  . Routine general medical examination at a health care facility 03/18/2013  . Lipoma 03/18/2013  . Need for prophylactic vaccination with combined diphtheria-tetanus-pertussis (DTP) vaccine 05/03/2013  . Other and unspecified hyperlipidemia 05/03/2013  . Difficulty sleeping 09/22/2013  . Left knee pain 11/08/2015  . Single skin nodule 06/06/2016  . Acute non-recurrent ethmoidal sinusitis 07/21/2016   Resolved Ambulatory Problems    Diagnosis Date Noted  . No Resolved Ambulatory Problems   Past Medical History:  Diagnosis Date  . Allergy       Review of Systems  All other systems reviewed and are negative.      Objective:   Physical Exam  Constitutional: She is oriented to person, place, and time. She appears well-developed and well-nourished.  HENT:  Head: Normocephalic and atraumatic.  Eyes: Pupils are equal, round, and reactive to light. Conjunctivae and EOM are normal. Right eye exhibits no discharge. Left eye exhibits no discharge.  Neck: Normal range of motion. Neck supple.  Cardiovascular: Normal rate and regular rhythm.  Lymphadenopathy:    She has no cervical adenopathy.  Neurological: She is alert and oriented to person, place, and time.  Psychiatric: She has a normal mood and affect. Her behavior is normal.           Assessment & Plan:  Marland KitchenMarland KitchenDiagnoses and all orders for this visit:  Double vision -     CBC -     Comprehensive metabolic panel -     C-reactive protein -     Ferritin -     Sedimentation rate -     TSH -     Vitamin B12 -     Vit D  25 hydroxy (rtn osteoporosis monitoring)  No energy -     CBC -     Comprehensive metabolic panel -     C-reactive protein -     Ferritin -     Sedimentation rate -     TSH -     Vitamin B12 -     Vit D  25 hydroxy (rtn osteoporosis monitoring)   Unclear etiology of double vision. Needs eye exam. Could be allergy drops making dry eye worse. Stop allergy eye drops. Continue with lubricating drops. No red flag symptoms seen today.   Labs ordered to evaluate fatigue.

## 2017-12-10 NOTE — Patient Instructions (Signed)
Diplopia Diplopia is the condition of having double vision or seeing two of a single object. There are many causes of diplopia. Some are not dangerous and can be easily corrected. Diplopia may also be a symptom of a serious medical problem. There are two types of diplopia.  Monocular diplopia. This is double vision that affects only one eye. Monocular diplopia is often caused by a clouding of the lens in your eye (cataract) or by disruptions in the way that your eye focuses light.  Binocular diplopia. This is double vision that affects both eyes. However, when you shut one eye, the double vision will go away. Binocular diplopia may be more serious. It can be caused by: ? Problems with the nerves or muscles that are responsible for eye movement. ? Neurologic diseases. ? Thyroid problems. ? Tumors. ? An infection near your eyes. ? A stroke.  You may need to see a health care provider who specializes in eye conditions (ophthalmologist) or a nerve specialist (neurologist) to find the cause. Follow these instructions at home:  Tell your health care provider about any changes in your vision.  Do not drive or operate heavy machinery if diplopia interferes with your vision.  Keep all follow-up visits as directed by your health care provider. This is important. Contact a health care provider if:  Your diplopia gets worse.  You develop any other symptoms along with your diplopia, such as: ? Weakness. ? Numbness. ? Headache. ? Eye pain. ? Clumsiness. ? Nausea. ? Drooping eyelids. ? Abnormal movement of one of your eyes. Get help right away if:  You have sudden vision loss.  You suddenly get a very bad headache.  You have sudden weakness or numbness.  You suddenly lose the ability to speak, understand speech, or both. This information is not intended to replace advice given to you by your health care provider. Make sure you discuss any questions you have with your health care  provider. Document Released: 05/25/2004 Document Revised: 12/30/2015 Document Reviewed: 06/17/2014 Elsevier Interactive Patient Education  2018 [employer]vier Inc.  

## 2017-12-11 LAB — FERRITIN: Ferritin: 111 ng/mL (ref 10–232)

## 2017-12-11 LAB — COMPREHENSIVE METABOLIC PANEL
AG Ratio: 1.8 (calc) (ref 1.0–2.5)
ALBUMIN MSPROF: 4.4 g/dL (ref 3.6–5.1)
ALKALINE PHOSPHATASE (APISO): 73 U/L (ref 33–130)
ALT: 12 U/L (ref 6–29)
AST: 14 U/L (ref 10–35)
BILIRUBIN TOTAL: 0.8 mg/dL (ref 0.2–1.2)
BUN: 13 mg/dL (ref 7–25)
CHLORIDE: 105 mmol/L (ref 98–110)
CO2: 27 mmol/L (ref 20–32)
Calcium: 9.4 mg/dL (ref 8.6–10.4)
Creat: 0.69 mg/dL (ref 0.50–1.05)
GLOBULIN: 2.4 g/dL (ref 1.9–3.7)
Glucose, Bld: 93 mg/dL (ref 65–99)
POTASSIUM: 4.2 mmol/L (ref 3.5–5.3)
Sodium: 139 mmol/L (ref 135–146)
Total Protein: 6.8 g/dL (ref 6.1–8.1)

## 2017-12-11 LAB — CBC
HEMATOCRIT: 42.7 % (ref 35.0–45.0)
HEMOGLOBIN: 14.4 g/dL (ref 11.7–15.5)
MCH: 29.2 pg (ref 27.0–33.0)
MCHC: 33.7 g/dL (ref 32.0–36.0)
MCV: 86.6 fL (ref 80.0–100.0)
MPV: 9.8 fL (ref 7.5–12.5)
Platelets: 280 10*3/uL (ref 140–400)
RBC: 4.93 10*6/uL (ref 3.80–5.10)
RDW: 12.8 % (ref 11.0–15.0)
WBC: 5.4 10*3/uL (ref 3.8–10.8)

## 2017-12-11 LAB — TSH: TSH: 1.02 mIU/L (ref 0.40–4.50)

## 2017-12-11 LAB — SEDIMENTATION RATE: Sed Rate: 2 mm/h (ref 0–30)

## 2017-12-11 LAB — VITAMIN B12: Vitamin B-12: 589 pg/mL (ref 200–1100)

## 2017-12-11 LAB — C-REACTIVE PROTEIN: CRP: 0.7 mg/L (ref <8.0)

## 2017-12-11 LAB — VITAMIN D 25 HYDROXY (VIT D DEFICIENCY, FRACTURES): VIT D 25 HYDROXY: 24 ng/mL — AB (ref 30–100)

## 2017-12-11 NOTE — Progress Notes (Signed)
Call pt: thyroid looks good.  Vitamin d low. Make sure taking D3 1000 units daily.   Pending b12 and ferritin.

## 2017-12-11 NOTE — Progress Notes (Signed)
Call pt: b12 and ferritin looks good.

## 2017-12-12 ENCOUNTER — Encounter: Payer: Self-pay | Admitting: Physical Therapy

## 2017-12-12 ENCOUNTER — Encounter: Payer: Self-pay | Admitting: Physician Assistant

## 2017-12-12 ENCOUNTER — Ambulatory Visit: Payer: Commercial Managed Care - PPO | Admitting: Physical Therapy

## 2017-12-12 DIAGNOSIS — M79601 Pain in right arm: Secondary | ICD-10-CM

## 2017-12-12 DIAGNOSIS — M25611 Stiffness of right shoulder, not elsewhere classified: Secondary | ICD-10-CM

## 2017-12-12 DIAGNOSIS — M6281 Muscle weakness (generalized): Secondary | ICD-10-CM

## 2017-12-12 NOTE — Patient Instructions (Signed)
Wrist Extension - can hold soup can instead of a weight.     Forearm resting on thigh (or other surface), palm down, weight in hand. Raise hand up ( count of 2) . Hold momentarily. Return slowly ( count of 4-5) . Repeat _15__ times.  Once a day Weight _1__ lbs.  After this exercise perform stretches and then ice if able.   CHEST: Doorway, Bilateral - Standing    Standing in doorway, place hands on wall with elbows bent at shoulder height. Take a small step forward. Hold __30_ seconds. _2__ reps per set, _1__ sets per day, _1__ days per week  .

## 2017-12-12 NOTE — Therapy (Signed)
[place], [place], [address] Phone: [phone]   Fax:  [phone]  Physical Therapy Treatment  Patient Details  Name: Leah Foley MRN: [id number] Date of Birth: 07-10-1961 Referring Provider: Dr Lynne Leader   Encounter Date: 12/12/2017  PT End of Session - 12/12/17 0935    Visit Number  2    Number of Visits  6    Date for PT Re-Evaluation  01/16/18    Authorization - Number of Visits  7    PT Start Time  0935    PT Stop Time  3299    PT Time Calculation (min)  39 min    Activity Tolerance  Patient tolerated treatment well       Past Medical History:  Diagnosis Date  . Allergy     Past Surgical History:  Procedure Laterality Date  . ABDOMINAL HYSTERECTOMY    . APPENDECTOMY      There were no vitals filed for this visit.  Subjective Assessment - 12/12/17 0935    Subjective  Pt is concerned that with certain activities she is causing more pain. She is using the cream for pain PRN.  She really wants to get back to gardening and ironing.     Patient Stated Goals  get rid of pain so she can not need medicine, get back to gardening without pain, strengthen the Rt UE    Currently in Pain?  Yes    Pain Score  5     Pain Location  Arm    Pain Orientation  Right    Pain Descriptors / Indicators  Aching;Dull    Pain Type  Chronic pain    Pain Onset  More than a month ago    Pain Frequency  Intermittent    Aggravating Factors   using the arm    Pain Relieving Factors  resting, ice                       [facility] Adult PT Treatment/Exercise - 12/12/17 0001      Self-Care   Self-Care  Other Self-Care Comments    Other Self-Care Comments   discussed allowing her arm to rest and not do as much with her arm.       Shoulder Exercises: Seated   Other Seated Exercises  eccentric wrist ext attempted with 2#, to much performed 15 reps with 1#, followed by wrist stretches.        Shoulder Exercises: Stretch   Other Shoulder Stretches  arm/forearm stretches per HEP with hand lower than shoulder    Other Shoulder Stretches  doorway streches mid      Modalities   Modalities  Ultrasound      Ultrasound   Ultrasound Location  5' each Rt extensor mass origin and biceps tendon 3' static 2 min moving    Ultrasound Parameters  static 20%, 0.80 w/cm2 1.50mz,     Ultrasound Goals  Pain;Edema      Manual Therapy   Manual Therapy  Myofascial release    Manual therapy comments  ice massage at end to Rt wirst extensor mass origin    Myofascial Release  cross friction massage Rt forearm with focus on the extensor origin.              PT Education - 12/12/17 1005    Education provided  Yes    Education Details  HEP    Person(s) Educated  Patient    Methods  Explanation;Demonstration;Handout    Comprehension  Returned demonstration;Verbalized understanding          PT Long Term Goals - 12/12/17 1204      PT LONG TERM GOAL #1   Title  I with HEP ( 01/16/18)     Status  On-going      PT LONG TERM GOAL #2   Title  demo Rt UE ROM equal to Lt without pain to allow her to resume using the Rt UE ( 01/16/18)     Status  On-going      PT LONG TERM GOAL #3   Title  demo Rt UE strength =/> 4+/5 without pain to allow her to return to gardening ( 01/16/18)     Status  On-going      PT LONG TERM GOAL #4   Title  improve FOTO =/< 38% limited ( 01/16/18)     Status  On-going      PT LONG TERM GOAL #5   Title  report =/> 75% reduction in Rt UE pain with daily activity ( 01/16/18)     Status  On-going            Plan - 12/12/17 1201    Clinical Impression Statement  This is [name]'s second visit, she was concerned that she seems to be aggrivating her arm with the HEP however she is still gardening, ironing and sweeping.  Instructed again in the benifits of resting the arm and explained that initially the cross friction massage may be more tender and it will improve  as the inflammation decreases.  She had decreased pain with todays treatment and felt better about therapy at the end of the session.  No goals met at this time.     Rehab Potential  Good    PT Frequency  1x / week    PT Duration  6 weeks    PT Treatment/Interventions  Iontophoresis 49m/ml Dexamethasone;Dry needling;Manual techniques;Moist Heat;Ultrasound;Therapeutic activities;Patient/family education;Therapeutic exercise;Cryotherapy;Electrical Stimulation;Vasopneumatic Device    PT Next Visit Plan  UKoreanonthermal to long Rt forearm extensor origin postural and RTC ex,   she doesn't want ionto at this time.     Consulted and Agree with Plan of Care  Patient       Patient will benefit from skilled therapeutic intervention in order to improve the following deficits and impairments:  Pain, Postural dysfunction, Impaired UE functional use, Decreased strength, Decreased range of motion, Increased muscle spasms, Increased edema  Visit Diagnosis: Pain in right arm  Stiffness of right shoulder, not elsewhere classified  Muscle weakness (generalized)     Problem List Patient Active Problem List   Diagnosis Date Noted  . Acute non-recurrent ethmoidal sinusitis 07/21/2016  . Single skin nodule 06/06/2016  . Left knee pain 11/08/2015  . Difficulty sleeping 09/22/2013  . Need for prophylactic vaccination with combined diphtheria-tetanus-pertussis (DTP) vaccine 05/03/2013  . Other and unspecified hyperlipidemia 05/03/2013  . Allergic rhinitis 03/18/2013  . Routine general medical examination at a health care facility 03/18/2013  . Lipoma 03/18/2013    SJeral PinchPT  12/12/2017, 12:05 PM  [facility] [address]Phone: [phone]  Fax:  [phone] Name: KKenslie AbbruzzeseMRN: [id number] of Birth: [birth year]

## 2017-12-13 DIAGNOSIS — R5383 Other fatigue: Secondary | ICD-10-CM | POA: Insufficient documentation

## 2017-12-13 DIAGNOSIS — H532 Diplopia: Secondary | ICD-10-CM | POA: Insufficient documentation

## 2017-12-13 HISTORY — DX: Diplopia: H53.2

## 2017-12-19 ENCOUNTER — Ambulatory Visit: Payer: Commercial Managed Care - PPO | Admitting: Physical Therapy

## 2017-12-19 ENCOUNTER — Other Ambulatory Visit: Payer: Self-pay | Admitting: Physician Assistant

## 2017-12-19 ENCOUNTER — Encounter: Payer: Self-pay | Admitting: Physical Therapy

## 2017-12-19 DIAGNOSIS — M79601 Pain in right arm: Secondary | ICD-10-CM

## 2017-12-19 DIAGNOSIS — M6281 Muscle weakness (generalized): Secondary | ICD-10-CM

## 2017-12-19 DIAGNOSIS — M25611 Stiffness of right shoulder, not elsewhere classified: Secondary | ICD-10-CM | POA: Diagnosis not present

## 2017-12-19 DIAGNOSIS — G43B0 Ophthalmoplegic migraine, not intractable: Secondary | ICD-10-CM | POA: Insufficient documentation

## 2017-12-19 DIAGNOSIS — H04123 Dry eye syndrome of bilateral lacrimal glands: Secondary | ICD-10-CM | POA: Insufficient documentation

## 2017-12-19 NOTE — Patient Instructions (Addendum)
Over Head Pull: Narrow Grip     [place] [id number]   On back, knees bent, feet flat, band across thighs, elbows straight but relaxed. Pull hands apart (start). Keeping elbows straight, bring arms up and over head, hands toward floor. Keep pull steady on band. Hold momentarily. Return slowly, keeping pull steady, back to start. Repeat _20-30__ times. Band color ___red___ . Every other day.   Side Pull: Double Arm   On back, knees bent, feet flat. Arms perpendicular to body, shoulder level, elbows straight but relaxed. Pull arms out to sides, elbows straight. Resistance band comes across collarbones, hands toward floor. Hold momentarily. Slowly return to starting position. Repeat _20-30__ times. Band color __red___ . Every other day.   Sash   On back, knees bent, feet flat, left hand on left hip, right hand above left. Pull right arm DIAGONALLY (hip to shoulder) across chest. Bring right arm along head toward floor. Hold momentarily. Slowly return to starting position. Repeat _20-30__ times. Do with left arm. Band color __red___.  Every other day.   Shoulder Rotation: Double Arm   On back, knees bent, feet flat, elbows tucked at sides, bent 90, hands palms up. Pull hands apart and down toward floor, keeping elbows near sides. Hold momentarily. Slowly return to starting position. Repeat _20-30__ times. Band color __red___.  Every other day.    ON TARGET: Finding Spine Stability    Lie on full roller, feet on floor. Arch and flatten entire spine. Think of tacking spine to roller, then letting go. Continue with less and less motion. Come to rest. Be aware of muscles being used. This is the target position for many Supine exercises.  Focus on: Neck Chest Low back

## 2017-12-19 NOTE — Therapy (Signed)
[place], [place], [address] Phone: [phone]   Fax:  [phone]  Physical Therapy Treatment  Patient Details  Name: Leah Foley MRN: [id number] Date of Birth: 01/15/1961 Referring Provider: Dr Lynne Leader   Encounter Date: 12/19/2017  PT End of Session - 12/19/17 0938    Visit Number  3    Number of Visits  6    Date for PT Re-Evaluation  01/16/18    Authorization - Number of Visits  7    PT Start Time  [phone]    PT Stop Time  1019    PT Time Calculation (min)  41 min    Activity Tolerance  Patient tolerated treatment well       Past Medical History:  Diagnosis Date  . Allergy     Past Surgical History:  Procedure Laterality Date  . ABDOMINAL HYSTERECTOMY    . APPENDECTOMY      There were no vitals filed for this visit.  Subjective Assessment - 12/19/17 0938    Subjective  Alexza reports she had great releif the day after the last tx.  The pain has slowly returned    Patient Stated Goals  get rid of pain so she can not need medicine, get back to gardening without pain, strengthen the Rt UE    Currently in Pain?  No/denies    Aggravating Factors   ironing          [facility] PT Assessment - 12/19/17 0001      Assessment   Medical Diagnosis  Rt elbow pain/bicep tendonitis    Referring Provider  Dr Lynne Leader    Onset Date/Surgical Date  11/14/17      Strength   Right/Left Shoulder  Right    Right Shoulder Flexion  4+/5    Right Shoulder ABduction  -- 5-/5    Right Shoulder External Rotation  4/5    Right Shoulder Horizontal ABduction  4+/5                   [facility] Adult PT Treatment/Exercise - 12/19/17 0001      Self-Care   Other Self-Care Comments   discussed time under tension concept with muscles and how it can and is flaring up her pain with ironing.       Exercises   Exercises  Shoulder      Shoulder Exercises: Supine   Horizontal ABduction   Strengthening;Both;20 reps;Theraband    Theraband Level (Shoulder Horizontal ABduction)  Level 2 (Red)    External Rotation  Strengthening;Both;20 reps;Theraband    Theraband Level (Shoulder External Rotation)  Level 2 (Red)    Diagonals  Strengthening;Both;20 reps;Theraband    Theraband Level (Shoulder Diagonals)  Level 2 (Red)    Other Supine Exercises  10 reps red band, overhead pull      Shoulder Exercises: Stretch   Other Shoulder Stretches  supine on foam roller      Modalities   Modalities  Ultrasound      Ultrasound   Ultrasound Location  3' static at Rt forearm extensor origin, 3' moving into muscle mass    Ultrasound Parameters  static 20%, 0.8 w/cm2, 1.0 mHZ, 50% , 1.43mz, 1.0 w/cm2    Ultrasound Goals  Pain;Edema      Manual Therapy   Myofascial Release  some cross friction massage to Rt forearm and 2' of ice massage  PT Education - 12/19/17 0951    Education provided  Yes    Education Details  HEP    Person(s) Educated  Patient    Methods  Explanation;Handout;Demonstration    Comprehension  Verbalized understanding;Returned demonstration          PT Long Term Goals - 12/19/17 0944      PT LONG TERM GOAL #1   Title  I with HEP ( 01/16/18)     Status  On-going      PT LONG TERM GOAL #2   Title  demo Rt UE ROM equal to Lt without pain to allow her to resume using the Rt UE ( 01/16/18)     Status  On-going pt having less pain with using her Rt UE, able to lift more      PT LONG TERM GOAL #3   Title  demo Rt UE strength =/> 4+/5 without pain to allow her to return to gardening ( 01/16/18)     Status  Partially Met      PT LONG TERM GOAL #4   Title  improve FOTO =/< 38% limited ( 01/16/18)     Status  On-going      PT LONG TERM GOAL #5   Title  report =/> 75% reduction in Rt UE pain with daily activity ( 01/16/18)     Status  On-going 30% improvement            Plan - 12/19/17 1118    Clinical Impression Statement  Eman is making  progress, having less forearm pain and able to do a little more lifting. Shoulder strength is improving and has partially met this goal.  She still has weakness in the Rt arm.     Rehab Potential  Good    PT Frequency  1x / week    PT Duration  6 weeks    PT Treatment/Interventions  Iontophoresis '4mg'$ /ml Dexamethasone;Dry needling;Manual techniques;Moist Heat;Ultrasound;Therapeutic activities;Patient/family education;Therapeutic exercise;Cryotherapy;Electrical Stimulation;Vasopneumatic Device    PT Next Visit Plan  assess response to new HEP, cont with non-thermal [place]     Consulted and Agree with Plan of Care  Patient       Patient will benefit from skilled therapeutic intervention in order to improve the following deficits and impairments:  Pain, Postural dysfunction, Impaired UE functional use, Decreased strength, Decreased range of motion, Increased muscle spasms, Increased edema  Visit Diagnosis: Muscle weakness (generalized)  Stiffness of right shoulder, not elsewhere classified  Pain in right arm     Problem List Patient Active Problem List   Diagnosis Date Noted  . 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 No energy 12/13/2017  . Acute non-recurrent ethmoidal sinusitis 07/21/2016  . Single skin nodule 06/06/2016  . Left knee pain 11/08/2015  . Difficulty sleeping 09/22/2013  . Need for prophylactic vaccination with combined diphtheria-tetanus-pertussis (DTP) vaccine 05/03/2013  . Other and unspecified hyperlipidemia 05/03/2013  . Allergic rhinitis 03/18/2013  . Routine general medical examination at a health care facility 03/18/2013  . Lipoma 03/18/2013    Jeral Pinch PT  12/19/2017, 11:21 AM  [facility] [place], [place], [address] Phone: [phone]   Fax:  [phone]  Name: Kearie Mennen MRN: [id number] Date of Birth: 08-Dec-1960

## 2017-12-26 ENCOUNTER — Encounter: Payer: Commercial Managed Care - PPO | Admitting: Physical Therapy

## 2017-12-26 ENCOUNTER — Telehealth: Payer: Self-pay | Admitting: Physician Assistant

## 2017-12-26 NOTE — Telephone Encounter (Signed)
I do not have results yet.

## 2017-12-28 ENCOUNTER — Encounter: Payer: Self-pay | Admitting: Physical Therapy

## 2017-12-28 ENCOUNTER — Ambulatory Visit: Payer: Commercial Managed Care - PPO | Admitting: Physical Therapy

## 2017-12-28 DIAGNOSIS — M79601 Pain in right arm: Secondary | ICD-10-CM

## 2017-12-28 DIAGNOSIS — M6281 Muscle weakness (generalized): Secondary | ICD-10-CM

## 2017-12-28 DIAGNOSIS — M25611 Stiffness of right shoulder, not elsewhere classified: Secondary | ICD-10-CM

## 2017-12-28 NOTE — Patient Instructions (Signed)

## 2017-12-28 NOTE — Therapy (Signed)
[place], [place], [address] Phone: [phone]   Fax:  [phone]  Physical Therapy Treatment  Patient Details  Name: Leah Foley MRN: [id number] Date of Birth: 12/08/1960 Referring Provider: Dr Lynne Leader   Encounter Date: 12/28/2017  PT End of Session - 12/28/17 1103    Visit Number  4    Number of Visits  6    Date for PT Re-Evaluation  01/16/18    PT Start Time  1103    PT Stop Time  1151    PT Time Calculation (min)  48 min    Activity Tolerance  Patient tolerated treatment well       Past Medical History:  Diagnosis Date  . Allergy     Past Surgical History:  Procedure Laterality Date  . ABDOMINAL HYSTERECTOMY    . APPENDECTOMY      There were no vitals filed for this visit.  Subjective Assessment - 12/28/17 1106    Subjective  Leah Foley states that her arm is little sore in the elbow especially with the band ER ex. the pain is moving into the forearm. The shoulder is feeling good.     Patient Stated Goals  get rid of pain so she can not need medicine, get back to gardening without pain, strengthen the Rt UE    Currently in Pain?  Yes    Pain Score  5     Pain Location  -- forearm    Pain Orientation  Right    Pain Descriptors / Indicators  Aching;Shooting    Pain Type  Chronic pain    Pain Onset  More than a month ago    Pain Frequency  Intermittent    Aggravating Factors   using the arm    Pain Relieving Factors  resting and ice                       [facility] Adult PT Treatment/Exercise - 12/28/17 0001      Shoulder Exercises: ROM/Strengthening   UBE (Upper Arm Bike)  L2x4' atl FWD/BWD      Shoulder Exercises: Stretch   Other Shoulder Stretches  forearm wrist stretching      Modalities   Modalities  Moist Heat;Ultrasound      Moist Heat Therapy   Number Minutes Moist Heat  10 Minutes    Moist Heat Location  -- Rt forearm      Ultrasound   Ultrasound Location  rt forearm extensor mass    Ultrasound Parameters  100%, 1.38mz, 1.5 w/cm2    Ultrasound Goals  Pain;Other (Comment) tone      Manual Therapy   Manual Therapy  Soft tissue mobilization    Soft tissue mobilization  STM to Rt forearm       Trigger Point Dry Needling - 12/28/17 1140    Consent Given?  Yes    Education Handout Provided  Yes    Muscles Treated Upper Body  -- Rt wrist extensor group                 PT Long Term Goals - 12/28/17 1145      PT LONG TERM GOAL #1   Title  I with HEP ( 01/16/18)     Status  On-going      PT LONG TERM GOAL #2   Title  demo Rt UE ROM equal to Lt without pain to allow her to resume using  the Rt UE ( 01/16/18)     Status  On-going      PT LONG TERM GOAL #3   Title  demo Rt UE strength =/> 4+/5 without pain to allow her to return to gardening ( 01/16/18)     Status  Partially Met      PT LONG TERM GOAL #4   Title  improve FOTO =/< 38% limited ( 01/16/18)     Status  On-going      PT LONG TERM GOAL #5   Title  report =/> 75% reduction in Rt UE pain with daily activity ( 01/16/18)     Status  Partially Met met for the shoulder, not for the elbow            Plan - 12/28/17 1145    Clinical Impression Statement  Leah Foley has had a small flare up of rt elbow/forearm pain, the shoulder pain has resolved all together.  She tolerated tx well today with decreased tightness in the forearm muscles at the end of the session. Making steady progress to her goals.     PT Frequency  1x / week    PT Duration  6 weeks    PT Treatment/Interventions  Iontophoresis '4mg'$ /ml Dexamethasone;Dry needling;Manual techniques;Moist Heat;Ultrasound;Therapeutic activities;Patient/family education;Therapeutic exercise;Cryotherapy;Electrical Stimulation;Vasopneumatic Device    PT Next Visit Plan  FOTO and assess     Consulted and Agree with Plan of Care  Patient       Patient will benefit from skilled therapeutic intervention in order to  improve the following deficits and impairments:     Visit Diagnosis: Muscle weakness (generalized)  Stiffness of right shoulder, not elsewhere classified  Pain in right arm     Problem List Patient Active Problem List   Diagnosis Date Noted  . Dry eyes, bilateral 12/19/2017  . 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 No energy 12/13/2017  . Acute non-recurrent ethmoidal sinusitis 07/21/2016  . Single skin nodule 06/06/2016  . Left knee pain 11/08/2015  . Difficulty sleeping 09/22/2013  . Need for prophylactic vaccination with combined diphtheria-tetanus-pertussis (DTP) vaccine 05/03/2013  . Other and unspecified hyperlipidemia 05/03/2013  . Allergic rhinitis 03/18/2013  . Routine general medical examination at a health care facility 03/18/2013  . Lipoma 03/18/2013    Leah Foley PT  12/28/2017, 11:47 AM  [facility] [place], [place], [address] Phone: [phone]   Fax:  [phone]  Name: Leah Foley MRN: [id number] Date of Birth: March 26, 1961

## 2018-01-02 ENCOUNTER — Encounter: Payer: Self-pay | Admitting: Physical Therapy

## 2018-01-02 ENCOUNTER — Ambulatory Visit: Payer: Commercial Managed Care - PPO | Admitting: Physical Therapy

## 2018-01-02 DIAGNOSIS — M6281 Muscle weakness (generalized): Secondary | ICD-10-CM

## 2018-01-02 DIAGNOSIS — M25611 Stiffness of right shoulder, not elsewhere classified: Secondary | ICD-10-CM

## 2018-01-02 DIAGNOSIS — M79601 Pain in right arm: Secondary | ICD-10-CM

## 2018-01-02 NOTE — Therapy (Addendum)
[place], [place], [address] Phone: [phone]   Fax:  [phone]  Physical Therapy Treatment  Patient Details  Name: Leah Foley MRN: [id number] Date of Birth: [birth year] Referring Provider: Dr Steva Colder   Encounter Date: 01/02/2018  PT End of Session - 01/02/18 0937    Visit Number  5    Number of Visits  6    Date for PT Re-Evaluation  01/16/18    PT Start Time  0937    PT Stop Time  1016    PT Time Calculation (min)  39 min    Activity Tolerance  Patient tolerated treatment well       Past Medical History:  Diagnosis Date  . Allergy     Past Surgical History:  Procedure Laterality Date  . ABDOMINAL HYSTERECTOMY    . APPENDECTOMY      There were no vitals filed for this visit.  Subjective Assessment - 01/02/18 0937    Subjective  Teah reports that the DN helped a lot. Was sore the day of treatment, then the next day was really nice.  Pain returned on Sunday after picking up a gardening tool and the pain returned that night.     Patient Stated Goals  get rid of pain so she can not need medicine, get back to gardening without pain, strengthen the Rt UE    Currently in Pain?  Yes    Pain Score  5     Pain Location  -- forearm    Pain Orientation  Right    Pain Descriptors / Indicators  Dull    Pain Type  Chronic pain    Pain Onset  More than a month ago    Pain Frequency  Intermittent    Aggravating Factors   using the arm    Pain Relieving Factors  rest and ice.          [facility] PT Assessment - 01/02/18 0001      Assessment   Medical Diagnosis  Rt elbow pain/bicep tendonitis    Referring Provider  Dr Steva Colder      Observation/Other Assessments   Focus on Therapeutic Outcomes (FOTO)   41% limited      Strength   Right/Left Wrist  -- Rt grip 32/20/21, Lt 47/45/41                   [facility] Adult PT Treatment/Exercise - 01/02/18 0001      Shoulder Exercises:  ROM/Strengthening   UBE (Upper Arm Bike)  L2x4' atl FWD/BWD      Modalities   Modalities  Ultrasound      Moist Heat Therapy   Number Minutes Moist Heat  --    Moist Heat Location  -- heat held today      Ultrasound   Ultrasound Location  Rt forearm extensor mass    Ultrasound Parameters  100%, 1.86mz, 1.5w/cm2    Ultrasound Goals  Pain;Other (Comment) tone      Manual Therapy   Manual Therapy  Soft tissue mobilization    Soft tissue mobilization  STM to Rt forearm       Trigger Point Dry Needling - 01/02/18 0956    Consent Given?  Yes    Education Handout Provided  No    Muscles Treated Upper Body  -- Rt forearm muscles with stim  PT Long Term Goals - 01/02/18 5681      PT LONG TERM GOAL #1   Title  I with HEP ( 01/16/18)     Status  Achieved      PT LONG TERM GOAL #2   Title  demo Rt UE ROM equal to Lt without pain to allow her to resume using the Rt UE ( 01/16/18)       PT LONG TERM GOAL #3   Title  demo Rt UE strength =/> 4+/5 without pain to allow her to return to gardening ( 01/16/18)       PT LONG TERM GOAL #4   Title  improve FOTO =/< 38% limited ( 01/16/18)     Status  On-going 41% limited      PT LONG TERM GOAL #5   Title  report =/> 75% reduction in Rt UE pain with daily activity ( 01/16/18)     Status  Partially Met            Plan - 01/02/18 1014    Clinical Impression Statement  Harlei had a good response to her last treatment however then she used her arm a lot and some of the pain has returned.  Still has some banding in the Rt forearm, it further decreased with todays treatement.  She will be out of town for a week and will have " forced " rest .  I would expect a lot of healing during this time.     Rehab Potential  Good    PT Frequency  1x / week    PT Duration  6 weeks    PT Treatment/Interventions  Iontophoresis 69m/ml Dexamethasone;Dry needling;Manual techniques;Moist Heat;Ultrasound;Therapeutic  activities;Patient/family education;Therapeutic exercise;Cryotherapy;Electrical Stimulation;Vasopneumatic Device    PT Next Visit Plan  measure and assess for possible every other week visits.     Consulted and Agree with Plan of Care  Patient       Patient will benefit from skilled therapeutic intervention in order to improve the following deficits and impairments:  Pain, Postural dysfunction, Impaired UE functional use, Decreased strength, Decreased range of motion, Increased muscle spasms, Increased edema  Visit Diagnosis: Muscle weakness (generalized)  Stiffness of right shoulder, not elsewhere classified  Pain in right arm     Problem List Patient Active Problem List   Diagnosis Date Noted  . Dry eyes, bilateral 12/19/2017  . 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 No energy 12/13/2017  . Acute non-recurrent ethmoidal sinusitis 07/21/2016  . Single skin nodule 06/06/2016  . Left knee pain 11/08/2015  . Difficulty sleeping 09/22/2013  . Need for prophylactic vaccination with combined diphtheria-tetanus-pertussis (DTP) vaccine 05/03/2013  . Other and unspecified hyperlipidemia 05/03/2013  . Allergic rhinitis 03/18/2013  . Routine general medical examination at a health care facility 03/18/2013  . Lipoma 03/18/2013    SJeral PinchPT 01/02/2018, 10:16 AM  [facility] [address]Phone: [phone]  Fax:  [phone] Name: Leah BennettMRN: [id number] of Birth: 620-Aug-1962  PHYSICAL THERAPY DISCHARGE SUMMARY  Visits from Start of Care: 5  Current functional level related to goals / functional outcomes: Unknown, had been doing well and making improvements.  She was going to be out of town and has not schedule since returning.  MD note reports she will continue to work her HEP   Remaining deficits: unknown  Education / Equipment: HEP Plan:  Patient goals were partially met. Patient is being discharged due to not returning since the last visit.  ?????     Jeral Pinch, PT 01/24/18 11:08 AM

## 2018-01-03 NOTE — Telephone Encounter (Signed)
Request for results placed with front office.

## 2018-01-09 ENCOUNTER — Encounter: Payer: Commercial Managed Care - PPO | Admitting: Physical Therapy

## 2018-01-10 ENCOUNTER — Encounter: Payer: Self-pay | Admitting: Family Medicine

## 2018-01-10 ENCOUNTER — Ambulatory Visit: Payer: Commercial Managed Care - PPO | Admitting: Family Medicine

## 2018-01-10 VITALS — BP 114/67 | HR 79 | Wt 176.0 lb

## 2018-01-10 DIAGNOSIS — M25521 Pain in right elbow: Secondary | ICD-10-CM | POA: Diagnosis not present

## 2018-01-10 MED ORDER — LORAZEPAM 0.5 MG PO TABS: ORAL_TABLET | ORAL | 0 refills | Status: DC

## 2018-01-10 NOTE — Telephone Encounter (Signed)
Spoke with pt in office today.  She agreed to just wait and see if symptoms persist or worsen before getting an MRI done.

## 2018-01-10 NOTE — Telephone Encounter (Signed)
Received eye exam results.  Nothing was mentioned about an MRI recommendation. LMOM with pt asking her if she still wanted to get the imaging.

## 2018-01-10 NOTE — Progress Notes (Signed)
Leah Foley is a 57 y.o. female who presents to [facility] today for right elbow and shoulder pain follow up.   Leah Foley was seen on 11/29/17 for right elbow pain though to be distal biceps tendonitis. She was treated with a trial of PT and had some mild improvement.  She notes that her shoulder pain has completely resolved but her elbow pain has improved a bit but is still quite bothersome.  She notes pain worse with activity.  She has the pain is interfering with her ability to play the piano and do gardening both of which are important to her.  She denies any radiating pain weakness or numbness.  She had dry needling recently and notes that she is having some swelling and soreness along the dorsal aspect of her forearm which is new.    ROS:  As above  Exam:  BP 114/67   Pulse 79   Wt 176 lb (79.8 kg)   BMI 28.41 kg/m  General: Well Developed, well nourished, and in no acute distress.  Neuro/Psych: Alert and oriented x3, extra-ocular muscles intact, able to move all 4 extremities, sensation grossly intact. Skin: Warm and dry, no rashes noted.  Respiratory: Not using accessory muscles, speaking in full sentences, trachea midline.  Cardiovascular: Pulses palpable, no extremity edema. Abdomen: Does not appear distended. MSK:  Left elbow slight bruising at the dorsal forearm otherwise unremarkable appearing Range of motion intact. Tender to palpation along the anterior elbow near the insertion of the biceps tendon. Some pain with resisted supination and elbow flexion present. Pulses capillary fill and sensation are intact distally.    Lab and Radiology Results EXAM: RIGHT ELBOW - COMPLETE 3+ VIEW  COMPARISON:  None.  FINDINGS: There is no evidence of fracture, dislocation, or joint effusion. There is no evidence of arthropathy or other focal bone abnormality. Soft tissues are unremarkable.  IMPRESSION: Negative  exam.   Electronically Signed   By: Inge Rise M.D.   On: 11/29/2017 14:47  I personally (independently) visualized and performed the interpretation of the images attached in this note.   Assessment and Plan: 57 y.o. female with persistent right elbow pain despite an adequate trial of physical therapy for greater than 6 weeks.  Pain is interfering with quality of life and activities.  Plan to proceed with MRI for further work-up and evaluation.  Recheck after MRI to discuss treatment plan and options.    Orders Placed This Encounter  Procedures  . MR ELBOW RIGHT WO CONTRAST    Standing Status:   Future    Standing Expiration Date:   03/13/2019    Order Specific Question:   What is the patient's sedation requirement?    Answer:   Anti-anxiety    Order Specific Question:   Does the patient have a pacemaker or implanted devices?    Answer:   No    Order Specific Question:   Preferred imaging location?    Answer:   Product/process development scientist (table limit-350lbs)    Order Specific Question:   Radiology Contrast Protocol - do NOT remove file path    Answer:   [email or web address]   Meds ordered this encounter  Medications  . LORazepam (ATIVAN) 0.5 MG tablet    Sig: 1-2 tabs 30 - 60 min prior to MRI. Do not drive with this medicine.    Dispense:  4 tablet    Refill:  0    Historical information moved to improve visibility of documentation.  Past Medical History:  Diagnosis Date  . Allergy    Past Surgical History:  Procedure Laterality Date  . ABDOMINAL HYSTERECTOMY    . APPENDECTOMY     Social History   Tobacco Use  . Smoking status: Never Smoker  . Smokeless tobacco: Never Used  Substance Use Topics  . Alcohol use: No   family history includes Cancer in her father; Kidney disease in her maternal grandfather; Lupus in her mother.  Medications: Current Outpatient Medications  Medication Sig Dispense Refill  . ibuprofen (ADVIL,MOTRIN) 200  MG tablet Take 200 mg by mouth every 6 (six) hours as needed.    . Multiple Vitamin (MULTIVITAMIN) tablet Take 1 tablet by mouth daily.    Marland Kitchen PAZEO 0.7 % SOLN     . LORazepam (ATIVAN) 0.5 MG tablet 1-2 tabs 30 - 60 min prior to MRI. Do not drive with this medicine. 4 tablet 0   No current facility-administered medications for this visit.    Allergies  Allergen Reactions  . Azithromycin Palpitations  . Sulfa Antibiotics Rash      Discussed warning signs or symptoms. Please see discharge instructions. Patient expresses understanding.

## 2018-01-10 NOTE — Patient Instructions (Signed)
Thank you for coming in today. You should hear about MRI scheduling.  Recheck with me a few days after MRI to go over results.  Take ativan 1-2 pills prior to MRI.  You should not drive after taking this medicine.   Lorazepam tablets What is this medicine? LORAZEPAM (lor A ze pam) is a benzodiazepine. It is used to treat anxiety. This medicine may be used for other purposes; ask your health care provider or pharmacist if you have questions. COMMON BRAND NAME(S): Ativan What should I tell my health care provider before I take this medicine? They need to know if you have any of these conditions: -glaucoma -history of drug or alcohol abuse problem -kidney disease -liver disease -lung or breathing disease, like asthma -mental illness -myasthenia gravis -Parkinson's disease -suicidal thoughts, plans, or attempt; a previous suicide attempt by you or a family member -an unusual or allergic reaction to lorazepam, other medicines, foods, dyes, or preservatives -pregnant or trying to get pregnant -breast-feeding How should I use this medicine? Take this medicine by mouth with a glass of water. Follow the directions on the prescription label. Take your medicine at regular intervals. Do not take it more often than directed. Do not stop taking except on your doctor's advice. A special MedGuide will be given to you by the pharmacist with each prescription and refill. Be sure to read this information carefully each time. Talk to your pediatrician regarding the use of this medicine in children. While this drug may be used in children as young as 12 years for selected conditions, precautions do apply. Overdosage: If you think you have taken too much of this medicine contact a poison control center or emergency room at once. NOTE: This medicine is only for you. Do not share this medicine with others. What if I miss a dose? If you miss a dose, take it as soon as you can. If it is almost time for your  next dose, take only that dose. Do not take double or extra doses. What may interact with this medicine? Do not take this medicine with any of the following medications: -narcotic medicines for cough -sodium oxybate This medicine may also interact with the following medications: -alcohol -antihistamines for allergy, cough and cold -certain medicines for anxiety or sleep -certain medicines for depression, like amitriptyline, fluoxetine, sertraline -certain medicines for seizures like carbamazepine, phenobarbital, phenytoin, primidone -general anesthetics like lidocaine, pramoxine, tetracaine -MAOIs like Carbex, Eldepryl, Marplan, Nardil, and Parnate -medicines that relax muscles for surgery -narcotic medicines for pain -phenothiazines like chlorpromazine, mesoridazine, prochlorperazine, thioridazine This list may not describe all possible interactions. Give your health care provider a list of all the medicines, herbs, non-prescription drugs, or dietary supplements you use. Also tell them if you smoke, drink alcohol, or use illegal drugs. Some items may interact with your medicine. What should I watch for while using this medicine? Tell your doctor or health care professional if your symptoms do not start to get better or if they get worse. Do not stop taking except on your doctor's advice. You may develop a severe reaction. Your doctor will tell you how much medicine to take. You may get drowsy or dizzy. Do not drive, use machinery, or do anything that needs mental alertness until you know how this medicine affects you. To reduce the risk of dizzy and fainting spells, do not stand or sit up quickly, especially if you are an older patient. Alcohol may increase dizziness and drowsiness. Avoid alcoholic drinks. If you  are taking another medicine that also causes drowsiness, you may have more side effects. Give your health care provider a list of all medicines you use. Your doctor will tell you how  much medicine to take. Do not take more medicine than directed. Call emergency for help if you have problems breathing or unusual sleepiness. What side effects may I notice from receiving this medicine? Side effects that you should report to your doctor or health care professional as soon as possible: -allergic reactions like skin rash, itching or hives, swelling of the face, lips, or tongue -breathing problems -confusion -loss of balance or coordination -signs and symptoms of low blood pressure like dizziness; feeling faint or lightheaded, falls; unusually weak or tired -suicidal thoughts or other mood changes Side effects that usually do not require medical attention (report to your doctor or health care professional if they continue or are bothersome): -dizziness -headache -nausea, vomiting -tiredness This list may not describe all possible side effects. Call your doctor for medical advice about side effects. You may report side effects to FDA at 1-800-FDA-1088. Where should I keep my medicine? Keep out of the reach of children. This medicine can be abused. Keep your medicine in a safe place to protect it from theft. Do not share this medicine with anyone. Selling or giving away this medicine is dangerous and against the law. This medicine may cause accidental overdose and death if taken by other adults, children, or pets. Mix any unused medicine with a substance like cat litter or coffee grounds. Then throw the medicine away in a sealed container like a sealed bag or a coffee can with a lid. Do not use the medicine after the expiration date. Store at room temperature between 20 and 25 degrees C (68 and 77 degrees F). Protect from light. Keep container tightly closed. NOTE: This sheet is a summary. It may not cover all possible information. If you have questions about this medicine, talk to your doctor, pharmacist, or health care provider.  2018 Elsevier/Gold Standard (2015-04-22  15:54:27)

## 2018-01-15 ENCOUNTER — Encounter: Payer: Commercial Managed Care - PPO | Admitting: Physical Therapy

## 2018-01-16 ENCOUNTER — Other Ambulatory Visit: Payer: Self-pay | Admitting: Physician Assistant

## 2018-01-16 NOTE — Progress Notes (Unsigned)
MRI

## 2018-01-18 ENCOUNTER — Other Ambulatory Visit: Payer: Self-pay | Admitting: Physician Assistant

## 2018-01-18 DIAGNOSIS — H532 Diplopia: Secondary | ICD-10-CM

## 2018-01-18 DIAGNOSIS — R29818 Other symptoms and signs involving the nervous system: Secondary | ICD-10-CM

## 2018-01-22 ENCOUNTER — Encounter: Payer: Self-pay | Admitting: Family Medicine

## 2018-01-22 ENCOUNTER — Ambulatory Visit: Payer: Commercial Managed Care - PPO | Admitting: Family Medicine

## 2018-01-22 ENCOUNTER — Telehealth: Payer: Self-pay | Admitting: Physician Assistant

## 2018-01-22 VITALS — BP 125/66 | HR 81 | Wt 176.6 lb

## 2018-01-22 DIAGNOSIS — M25521 Pain in right elbow: Secondary | ICD-10-CM

## 2018-01-22 DIAGNOSIS — H532 Diplopia: Secondary | ICD-10-CM

## 2018-01-22 DIAGNOSIS — M25511 Pain in right shoulder: Secondary | ICD-10-CM

## 2018-01-22 NOTE — Patient Instructions (Signed)
Proceed with MRI. Discuss with MRI scheduling about possibly combing brian and elbow on the same day.   Recheck with me after MRI.  Continue shoulder exercises.   Thumbs up hand position with shoulder exercise is less likely to hurt your elbow.

## 2018-01-22 NOTE — Telephone Encounter (Signed)
I got a letter from ophthalmologist stating that she suggested a MRI due to diplopia that she has been having. If resolved we don't need to do.

## 2018-01-22 NOTE — Telephone Encounter (Signed)
Left pt msg to call back  

## 2018-01-22 NOTE — Progress Notes (Signed)
Leah Foley is a 57 y.o. female who presents to [facility] today for right shoulder pain.  I have been seeing Leah Foley several times for right elbow pain.  She is scheduled for an MRI after failing conservative management.  She notes that she is been having some shoulder pain as well.  She notes pain is located in the lateral upper arm and is worse with overhead motion and reaching back.  She notes the pain started after a particularly intensive physical therapy session for her elbow.  She is been unable to do physical therapy exercises for her shoulder because her elbow hurts.  She denies any severe neck pain weakness or numbness.  She denies any recent shoulder injury.  She is tried some over-the-counter medicines for pain which helped a bit.  The shoulder pain is moderate and does interfere with her quality of life preventing playing the piano and gardening easily.  Additionally patient has been seen by her primary care provider recently for diplopia and after ophthalmology evaluation is scheduled for a brain MRI with and without contrast.    ROS:  As above  Exam:  BP 125/66   Pulse 81   Wt 176 lb 9.6 oz (80.1 kg)   SpO2 99%   BMI 28.50 kg/m  General: Well Developed, well nourished, and in no acute distress.  Neuro/Psych: Alert and oriented x3, extra-ocular muscles intact, able to move all 4 extremities, sensation grossly intact. Skin: Warm and dry, no rashes noted.  Respiratory: Not using accessory muscles, speaking in full sentences, trachea midline.  Cardiovascular: Pulses palpable, no extremity edema. Abdomen: Does not appear distended. MSK:  Right shoulder normal-appearing nontender. Full range of motion. Mildly positive Hawkins and Neer's test. Positive empty can test. Strength abduction 4/5 external rotation 4+/5 internal rotation 5/5 Negative crossover arm compression test  Contralateral left shoulder nontender full range of  motion negative impingement testing full strength.  Right elbow normal-appearing without significant swelling or ecchymosis. Tender to palpation at the dorsal aspect of her elbow just distal to the lateral malleolus and the extensor muscle belly.  Additionally patient is tender to palpation along the anterior aspect of the elbow. Elbow motion has intact flexion but slightly limited extension limited to about 3 degrees.  Supination lacks about 1 to 3 degrees as well.  Full pronation range of motion.  Wrist strength pulses cap refill and sensation are intact    Lab and Radiology Results EXAM: RIGHT ELBOW - COMPLETE 3+ VIEW  COMPARISON:  None.  FINDINGS: There is no evidence of fracture, dislocation, or joint effusion. There is no evidence of arthropathy or other focal bone abnormality. Soft tissues are unremarkable.  IMPRESSION: Negative exam.   Electronically Signed   By: Inge Rise M.D.   On: 11/29/2017 14:47 I personally (independently) visualized and performed the interpretation of the images attached in this note.   Assessment and Plan: 57 y.o. female with  Right shoulder pain symptoms consistent with rotator cuff tendinitis versus subacromial impingement or bursitis.  I believe the symptoms are largely due to care and not being able to use her arm normally because of her elbow pain.  I suspect that when the elbow pain resolves the shoulder pain also resolved.  Plan to use home exercise program.  I discussed modification of the conventional shoulder range of motion and strengthening program to work around her elbow.  Follow-up after elbow MRI.  Additionally we discussed her upcoming brain MRI.  This  possibly could be done at the same time as her elbow MRI but will be a long time in the machine.  She is thinking about possibly split in the 2 studies up.  If she does get the brain MRI at the same time I am happy to discuss the results on her follow-up for her elbow MRI.   We will send a copy of this note as well as subsequent note to PCP.   I spent 25 minutes with this patient, greater than 50% was face-to-face time counseling regarding ddx and treatment plan.  Historical information moved to improve visibility of documentation.  Past Medical History:  Diagnosis Date  . Allergy   . 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 ABDOMINAL HYSTERECTOMY    . APPENDECTOMY     Social History   Tobacco Use  . Smoking status: Never Smoker  . Smokeless tobacco: Never Used  Substance Use Topics  . Alcohol use: No   family history includes Cancer in her father; Kidney disease in her maternal grandfather; Lupus in her mother.  Medications: Current Outpatient Medications  Medication Sig Dispense Refill  . ibuprofen (ADVIL,MOTRIN) 200 MG tablet Take 200 mg by mouth every 6 (six) hours as needed.    Marland Kitchen LORazepam (ATIVAN) 0.5 MG tablet 1-2 tabs 30 - 60 min prior to MRI. Do not drive with this medicine. 4 tablet 0  . Multiple Vitamin (MULTIVITAMIN) tablet Take 1 tablet by mouth daily.    Marland Kitchen PAZEO 0.7 % SOLN      No current facility-administered medications for this visit.    Allergies  Allergen Reactions  . Azithromycin Palpitations  . Sulfa Antibiotics Rash      Discussed warning signs or symptoms. Please see discharge instructions. Patient expresses understanding.

## 2018-01-22 NOTE — Telephone Encounter (Signed)
PT is questioning why a MRI head scan is needed. Please contact PT

## 2018-01-23 NOTE — Telephone Encounter (Signed)
Pt has been advised.

## 2018-01-28 ENCOUNTER — Other Ambulatory Visit: Payer: Commercial Managed Care - PPO

## 2018-01-28 ENCOUNTER — Ambulatory Visit (INDEPENDENT_AMBULATORY_CARE_PROVIDER_SITE_OTHER): Payer: Commercial Managed Care - PPO

## 2018-01-28 DIAGNOSIS — R42 Dizziness and giddiness: Secondary | ICD-10-CM | POA: Diagnosis not present

## 2018-01-28 DIAGNOSIS — R29818 Other symptoms and signs involving the nervous system: Secondary | ICD-10-CM | POA: Diagnosis not present

## 2018-01-28 MED ORDER — GADOBENATE DIMEGLUMINE 529 MG/ML IV SOLN
15.0000 mL | Freq: Once | INTRAVENOUS | Status: AC | PRN
  Administered 2018-01-28: 15 mL via INTRAVENOUS

## 2018-01-28 NOTE — Progress Notes (Signed)
Call pt: negative MRI. GREAT news!

## 2018-01-29 ENCOUNTER — Ambulatory Visit (HOSPITAL_COMMUNITY)
Admission: RE | Admit: 2018-01-29 | Discharge: 2018-01-29 | Disposition: A | Discharge status: Home / Self Care | Payer: Commercial Managed Care - PPO | Source: Ambulatory Visit | Attending: Physician Assistant | Admitting: Physician Assistant

## 2018-01-29 DIAGNOSIS — H532 Diplopia: Secondary | ICD-10-CM | POA: Insufficient documentation

## 2018-01-29 DIAGNOSIS — R29818 Other symptoms and signs involving the nervous system: Secondary | ICD-10-CM | POA: Diagnosis present

## 2018-01-29 NOTE — Progress Notes (Addendum)
*  Preliminary Results* Carotid artery duplex has been completed. Findings suggest 1-39% internal carotid artery stenosis. Vertebral arteries are patent with antegrade flow.  01/29/2018 10:22 AM  Maudry Mayhew, BS, RVT, RDCS, RDMS

## 2018-01-29 NOTE — Progress Notes (Signed)
Carotids look good with no blockage.

## 2018-02-04 ENCOUNTER — Ambulatory Visit (INDEPENDENT_AMBULATORY_CARE_PROVIDER_SITE_OTHER): Payer: Commercial Managed Care - PPO

## 2018-02-04 DIAGNOSIS — M25521 Pain in right elbow: Secondary | ICD-10-CM

## 2018-02-04 DIAGNOSIS — M778 Other enthesopathies, not elsewhere classified: Secondary | ICD-10-CM | POA: Diagnosis not present

## 2018-02-04 DIAGNOSIS — S56521A Laceration of other extensor muscle, fascia and tendon at forearm level, right arm, initial encounter: Secondary | ICD-10-CM | POA: Diagnosis not present

## 2018-02-04 DIAGNOSIS — X58XXXA Exposure to other specified factors, initial encounter: Secondary | ICD-10-CM

## 2018-02-11 ENCOUNTER — Encounter: Payer: Self-pay | Admitting: Family Medicine

## 2018-02-11 ENCOUNTER — Ambulatory Visit: Payer: Commercial Managed Care - PPO | Admitting: Family Medicine

## 2018-02-11 VITALS — BP 120/60 | HR 75 | Wt 176.0 lb

## 2018-02-11 DIAGNOSIS — S56519A Strain of other extensor muscle, fascia and tendon at forearm level, unspecified arm, initial encounter: Secondary | ICD-10-CM

## 2018-02-11 DIAGNOSIS — M7711 Lateral epicondylitis, right elbow: Secondary | ICD-10-CM

## 2018-02-11 NOTE — Progress Notes (Signed)
Leah Foley is a 57 y.o. female who presents to [facility] today for right elbow pain.  Cyan has been seen multiple times for right elbow pain.  She suffered an injury to her right elbow in mid April.  She was seen by me starting in April with normal x-rays.  She had a greater than 6-week trial of physical therapy which failed to improve her symptoms.  Eventually we proceeded with an MRI to evaluate the cause of her pain.  MRI was performed last week and she is here today for follow-up.  MRI shows a partial tear of the common extensor tendon insertion onto the lateral epicondyle as well as severe tendinosis.  She notes the location of the pain is felt more anterior than the lateral epicondyle.  She notes that her symptoms are worse with activity and sometimes will cause hand swelling as well.    ROS:  As above  Exam:  BP 120/60   Pulse 75   Wt 176 lb (79.8 kg)   BMI 28.41 kg/m  General: Well Developed, well nourished, and in no acute distress.  Neuro/Psych: Alert and oriented x3, extra-ocular muscles intact, able to move all 4 extremities, sensation grossly intact. Skin: Warm and dry, no rashes noted.  Respiratory: Not using accessory muscles, speaking in full sentences, trachea midline.  Cardiovascular: Pulses palpable, no extremity edema. Abdomen: Does not appear distended. MSK:  Right elbow normal-appearing Range of motion extension limited by about 3 degrees.  Full flexion. Supination limited by about 5 degrees full pronation. Tender to palpation at the lateral epicondyles and along the anterior lateral elbow Pain reproduced with extension against resistance of the third digit. Pain and weakness present with finger extension. Pulses capillary refill and sensation are intact.   Lab and Radiology Results EXAM: MRI OF THE RIGHT ELBOW WITHOUT CONTRAST  TECHNIQUE: Multiplanar, multisequence MR imaging of the elbow was performed.  No intravenous contrast was administered.  COMPARISON:  None.  FINDINGS: TENDONS  Common forearm flexor origin: Intact.  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  Biceps: Intact.  Triceps: Intact.  LIGAMENTS  Medial stabilizers: Intact.  Lateral stabilizers:  Intact.  Cartilage: No chondral defect.  Joint: No joint effusion.  Cubital tunnel: Normal.  Bones: No acute osseous abnormality.  No aggressive osseous lesion.  IMPRESSION: 1. 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   Electronically Signed   By: Kathreen Devoid   On: 02/04/2018 09:53   I personally (independently) visualized and performed the interpretation of the images attached in this note.   Assessment and Plan: 57 y.o. female with  Right elbow pain: Partial tear and severe tendinosis of the common extensor tendon.  Patient is failing conservative management.  After lengthy discussion of her treatment options plan to refer to orthopedic or hand surgery.  She would like to avoid steroid injections which I agree are not very evidence-based treatment for lateral epicondylitis or common extensor tear.  We discussed options and a platelet rich plasma injection which are also not extremely evidence-based.  Plan for referral to surgery and recheck as needed.  In the meantime patient may use a wrist brace as needed for comfort with activity and will proceed with continued stretching and strengthening home exercise program.  I spent 15 minutes with this patient, greater than 50% was face-to-face time counseling regarding ddx and plan.   Orders Placed This  Encounter  Procedures  . Ambulatory referral to Orthopedic Surgery    Referral Priority:   Routine    Referral Type:   Surgical    Referral Reason:   Specialty Services Required    Requested Specialty:   Orthopedic Surgery      Number of Visits Requested:   1   No orders of the defined types were placed in this encounter.   Historical information moved to improve visibility of documentation.  Past Medical History:  Diagnosis Date  . Allergy   . 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 ABDOMINAL HYSTERECTOMY    . APPENDECTOMY     Social History   Tobacco Use  . Smoking status: Never Smoker  . Smokeless tobacco: Never Used  Substance Use Topics  . Alcohol use: No   family history includes Cancer in her father; Kidney disease in her maternal grandfather; Lupus in her mother.  Medications: Current Outpatient Medications  Medication Sig Dispense Refill  . ibuprofen (ADVIL,MOTRIN) 200 MG tablet Take 200 mg by mouth every 6 (six) hours as needed.    Marland Kitchen LORazepam (ATIVAN) 0.5 MG tablet 1-2 tabs 30 - 60 min prior to MRI. Do not drive with this medicine. 4 tablet 0  . Multiple Vitamin (MULTIVITAMIN) tablet Take 1 tablet by mouth daily.    Marland Kitchen PAZEO 0.7 % SOLN      No current facility-administered medications for this visit.    Allergies  Allergen Reactions  . Azithromycin Palpitations  . Sulfa Antibiotics Rash      Discussed warning signs or symptoms. Please see discharge instructions. Patient expresses understanding.

## 2018-02-11 NOTE — Patient Instructions (Signed)
Thank you for coming in today. Use a wrist brace as need with activity for pain.  Continue stretching.  Recheck as needed. Marland Kitchen  Keep me posted.    Tennis Elbow Tennis elbow (lateral epicondylitis) is inflammation of the outer tendons of your forearm close to your elbow. Your tendons attach your muscles to your bones. The outer tendons of your forearm are used to extend your wrist, and they attach on the outside part of your elbow. Tennis elbow is often found in people who play tennis, but anyone may get the condition from repeatedly extending the wrist or turning the forearm. What are the causes? This condition is caused by repeatedly extending your wrist and using your hands. It can result from sports or work that requires repetitive forearm movements. Tennis elbow may also be caused by an injury. What increases the risk? You have a higher risk of developing tennis elbow if you play tennis or another racquet sport. You also have a higher risk if you frequently use your hands for work. This condition is also more likely to develop in:  Musicians.  Carpenters, painters, and plumbers.  Cooks.  Cashiers.  People who work in [employer].  Architect workers.  Butchers.  People who use computers.  What are the signs or symptoms? Symptoms of this condition include:  Pain and tenderness in your forearm and the outer part of your elbow. You may only feel the pain when you use your arm, or you may feel it even when you are not using your arm.  A burning feeling that runs from your elbow through your arm.  Weak grip in your hands.  How is this diagnosed? This condition may be diagnosed by medical history and physical exam. You may also have other tests, including:  X-rays.  MRI.  How is this treated? Your health care provider will recommend lifestyle adjustments, such as resting and icing your arm. Treatment may also include:  Medicines for inflammation. This may include shots of  cortisone if your pain continues.  Physical therapy. This may include massage or exercises.  An elbow brace.  Surgery may eventually be recommended if your pain does not go away with treatment. Follow these instructions at home: Activity  Rest your elbow and wrist as directed by your health care provider. Try to avoid any activities that caused the problem until your health care provider says that you can do them again.  If a physical therapist teaches you exercises, do all of them as directed.  If you lift an object, lift it with your palm facing upward. This lowers the stress on your elbow. Lifestyle  If your tennis elbow is caused by sports, check your equipment and make sure that: ? You are using it correctly. ? It is the best fit for you.  If your tennis elbow is caused by work, take breaks frequently, if you are able. Talk with your manager about how to best perform tasks in a way that is safe. ? If your tennis elbow is caused by computer use, talk with your manager about any changes that can be made to your work environment. General instructions  If directed, apply ice to the painful area: ? Put ice in a plastic bag. ? Place a towel between your skin and the bag. ? Leave the ice on for 20 minutes, 2-3 times per day.  Take medicines only as directed by your health care provider.  If you were given a brace, wear it as directed  by your health care provider.  Keep all follow-up visits as directed by your health care provider. This is important. Contact a health care provider if:  Your pain does not get better with treatment.  Your pain gets worse.  You have numbness or weakness in your forearm, hand, or fingers. This information is not intended to replace advice given to you by your health care provider. Make sure you discuss any questions you have with your health care provider. Document Released: 07/24/2005 Document Revised: 03/23/2016 Document Reviewed:  07/20/2014 Elsevier Interactive Patient Education  [employer].

## 2018-03-13 ENCOUNTER — Other Ambulatory Visit: Payer: Self-pay | Admitting: Orthopedic Surgery

## 2018-03-13 DIAGNOSIS — M67912 Unspecified disorder of synovium and tendon, left shoulder: Secondary | ICD-10-CM

## 2018-03-18 ENCOUNTER — Ambulatory Visit: Payer: Commercial Managed Care - PPO

## 2018-03-18 ENCOUNTER — Other Ambulatory Visit: Payer: Self-pay | Admitting: Orthopedic Surgery

## 2018-03-18 DIAGNOSIS — M67911 Unspecified disorder of synovium and tendon, right shoulder: Secondary | ICD-10-CM

## 2018-03-25 ENCOUNTER — Ambulatory Visit: Payer: Commercial Managed Care - PPO

## 2018-03-25 ENCOUNTER — Other Ambulatory Visit: Payer: Self-pay | Admitting: Orthopedic Surgery

## 2018-03-25 DIAGNOSIS — M6790 Unspecified disorder of synovium and tendon, unspecified site: Secondary | ICD-10-CM

## 2018-06-02 ENCOUNTER — Encounter: Payer: Self-pay | Admitting: Emergency Medicine

## 2018-06-02 ENCOUNTER — Other Ambulatory Visit: Payer: Self-pay

## 2018-06-02 ENCOUNTER — Emergency Department
Admission: EM | Admit: 2018-06-02 | Discharge: 2018-06-02 | Disposition: A | Discharge status: Home / Self Care | Payer: Commercial Managed Care - PPO | Source: Home / Self Care | Attending: Family Medicine | Admitting: Family Medicine

## 2018-06-02 ENCOUNTER — Emergency Department (INDEPENDENT_AMBULATORY_CARE_PROVIDER_SITE_OTHER): Payer: Commercial Managed Care - PPO

## 2018-06-02 DIAGNOSIS — R079 Chest pain, unspecified: Secondary | ICD-10-CM | POA: Diagnosis not present

## 2018-06-02 DIAGNOSIS — R0602 Shortness of breath: Secondary | ICD-10-CM

## 2018-06-02 DIAGNOSIS — M940 Chondrocostal junction syndrome [Tietze]: Secondary | ICD-10-CM | POA: Diagnosis not present

## 2018-06-02 NOTE — Discharge Instructions (Addendum)
Put ice on the painful area: Put ice in a plastic bag. Place a towel between your skin and the bag. Leave the ice on for 20 minutes, 2-3 times a day. May take Ibuprofen 200mg , 4 tabs every 8 hours with food.

## 2018-06-02 NOTE — ED Provider Notes (Signed)
[facility]    CSN: [id number] Arrival date & time: 06/02/18  1142     History   Chief Complaint Chief Complaint  Patient presents with  . Chest Pain    HPI Leah Foley is a 57 y.o. female.   Patient complains of an episode of anterior chest tightness and shortness of breath about 2 weeks ago after working in her yard.  A week later she had similar symptoms after working outside.  Two days ago she had similar but increased symptoms after placing peatmoss in her yard.  This morning while climbing out of bed she had distinct pain in her sternum with chest movement, and mild sensation of shortness of breath.  No recent URI symptoms.  No fevers, chills, and sweats. No lower leg pain or swelling.  The history is provided by the patient.  Chest Pain  Pain location:  Substernal area Pain quality: sharp and stabbing   Pain radiates to:  Does not radiate Pain severity:  Severe Onset quality:  Gradual Duration:  2 weeks Timing:  Sporadic Progression:  Worsening Chronicity:  New Context: breathing, lifting, movement and at rest   Context: not eating and not trauma   Relieved by:  None tried Worsened by:  Certain positions, coughing, deep breathing and movement Ineffective treatments:  None tried Associated symptoms: shortness of breath   Associated symptoms: no abdominal pain, no AICD problem, no anxiety, no claudication, no cough, no diaphoresis, no dizziness, no dysphagia, no fatigue, no fever, no lower extremity edema, no nausea, no near-syncope, no palpitations and no syncope     Past Medical History:  Diagnosis Date  . Allergy   . 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 Lateral epicondylitis of right elbow 02/11/2018  . Partial tear of common extensor tendon of elbow 02/11/2018  . Dry eyes, bilateral 12/19/2017  . 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 No energy 12/13/2017  . Acute  non-recurrent ethmoidal sinusitis 07/21/2016  . Single skin nodule 06/06/2016  . Left knee pain 11/08/2015  . Difficulty sleeping 09/22/2013  . Need for prophylactic vaccination with combined diphtheria-tetanus-pertussis (DTP) vaccine 05/03/2013  . Other and unspecified hyperlipidemia 05/03/2013  . Allergic rhinitis 03/18/2013  . Routine general medical examination at a health care facility 03/18/2013  . Lipoma 03/18/2013    Past Surgical History:  Procedure Laterality Date  . ABDOMINAL HYSTERECTOMY    . APPENDECTOMY         Home Medications    Prior to Admission medications   Medication Sig Start Date End Date Taking? Authorizing Provider  ibuprofen (ADVIL,MOTRIN) 200 MG tablet Take 200 mg by mouth every 6 (six) hours as needed.    [provider]  LORazepam (ATIVAN) 0.5 MG tablet 1-2 tabs 30 - 60 min prior to MRI. Do not drive with this medicine. 01/10/18   Gregor Hams, MD  Multiple Vitamin (MULTIVITAMIN) tablet Take 1 tablet by mouth daily.    [provider]  PAZEO 0.7 % SOLN  12/13/17   [provider]    Family History Family History  Problem Relation Age of Onset  . Lupus Mother   . Cancer Father   . Kidney disease Maternal Grandfather     Social History Social History   Tobacco Use  . Smoking status: Never Smoker  . Smokeless tobacco: Never Used  Substance Use Topics  . Alcohol use: No  . Drug use:  No     Allergies   Azithromycin and Sulfa antibiotics   Review of Systems Review of Systems  Constitutional: Negative for diaphoresis, fatigue and fever.  HENT: Negative for trouble swallowing.   Respiratory: Positive for shortness of breath. Negative for cough.   Cardiovascular: Positive for chest pain. Negative for palpitations, claudication, syncope and near-syncope.  Gastrointestinal: Negative for abdominal pain and nausea.  Neurological: Negative for dizziness.  All other systems reviewed and are negative.    Physical  Exam Triage Vital Signs ED Triage Vitals  Enc Vitals Group     BP 06/02/18 1238 110/71     Pulse Rate 06/02/18 1238 82     Resp 06/02/18 1238 18     Temp 06/02/18 1238 98.2 F (36.8 C)     Temp Source 06/02/18 1238 Oral     SpO2 06/02/18 1238 100 %     Weight 06/02/18 1239 175 lb (79.4 kg)     Height 06/02/18 1239 5\' 6"  (1.676 m)     Head Circumference --      Peak Flow --      Pain Score 06/02/18 1238 3     Pain Loc --      Pain Edu? --      Excl. in [place]? --    No data found.  Updated Vital Signs BP 110/71 (BP Location: Right Arm)   Pulse 82   Temp 98.2 F (36.8 C) (Oral)   Resp 18   Ht 5\' 6"  (1.676 m)   Wt 79.4 kg   SpO2 100%   BMI 28.25 kg/m   Visual Acuity Right Eye Distance:   Left Eye Distance:   Bilateral Distance:    Right Eye Near:   Left Eye Near:    Bilateral Near:     Physical Exam Nursing notes and Vital Signs reviewed. Appearance:  Patient appears stated age, and in no acute distress.    Eyes:  Pupils are equal, round, and reactive to light and accomodation.  Extraocular movement is intact.  Conjunctivae are not inflamed   Pharynx:  Normal; moist mucous membranes  Neck:  Supple.  No adenopathy Lungs:  Clear to auscultation.  Breath sounds are equal.  Moving air well. Chest:  Distinct tenderness to palpation over the inferior third of sternum, with marked tenderness over the xiphoid process.  Palpation there recreates her pain. Heart:  Regular rate and rhythm without murmurs, rubs, or gallops.  Abdomen:  Nontender without masses or hepatosplenomegaly.  Bowel sounds are present.  No CVA or flank tenderness.  Extremities:  No edema or tenderness to palpation in the lower extremities. Skin:  No rash present.     UC Treatments / Results  Labs (all labs ordered are listed, but only abnormal results are displayed) Labs Reviewed - No data to display  EKG  Rate:  76 BPM PR:  150 msec QT:  390 msec QTcH:  438 msec QRSD:  88 msec QRS axis:  31  degrees Interpretation:  Within normal limits; normal sinus rhythm  Radiology Dg Chest 2 View  Result Date: 06/02/2018 CLINICAL DATA:  Short of breath for 2 weeks with central chest pain beginning this morning. EXAM: CHEST - 2 VIEW COMPARISON:  None. FINDINGS: Cardiac silhouette is normal in size. No mediastinal or hilar masses. No evidence of adenopathy. Lungs are hyperexpanded. Are mildly prominent bronchovascular markings bilaterally. No evidence of pneumonia or pulmonary edema. No pleural effusion or pneumothorax. Skeletal structures are intact. IMPRESSION: No active cardiopulmonary  disease. Electronically Signed   By: Lajean Manes M.D.   On: 06/02/2018 12:59    Procedures Procedures (including critical care time)  Medications Ordered in UC Medications - No data to display  Initial Impression / Assessment and Plan / UC Course  I have reviewed the triage vital signs and the nursing notes.  Pertinent labs & imaging results that were available during my care of the patient were reviewed by me and considered in my medical decision making (see chart for details).    EKG normal and chest X-ray unremarkable. Followup with Dr. Aundria Mems or Dr. Lynne Leader ([facility]) if not improving about one month.   Final Clinical Impressions(s) / UC Diagnoses   Final diagnoses:  Costochondritis     Discharge Instructions      Put ice on the painful area: ? Put ice in a plastic bag. ? Place a towel between your skin and the bag. ? Leave the ice on for 20 minutes, 2-3 times a day.  May take Ibuprofen 200mg , 4 tabs every 8 hours with food.     ED Prescriptions    None         Kandra Nicolas, MD 06/02/18 1314

## 2018-06-02 NOTE — ED Triage Notes (Signed)
Patient has had some pain upon inspiration with shortness of breath for past 2 weeks; doing yard work; occasionally using mask; lifting heavy objects. Today she had episode of substernal pain along with the breathing discomfort.

## 2018-06-18 ENCOUNTER — Encounter: Payer: Self-pay | Admitting: Physician Assistant

## 2018-06-18 ENCOUNTER — Ambulatory Visit: Payer: Commercial Managed Care - PPO | Admitting: Physician Assistant

## 2018-06-18 VITALS — BP 104/61 | HR 71 | Ht 66.0 in | Wt 173.0 lb

## 2018-06-18 DIAGNOSIS — Z131 Encounter for screening for diabetes mellitus: Secondary | ICD-10-CM

## 2018-06-18 DIAGNOSIS — Z13220 Encounter for screening for lipoid disorders: Secondary | ICD-10-CM | POA: Diagnosis not present

## 2018-06-18 DIAGNOSIS — Z0000 Encounter for general adult medical examination without abnormal findings: Secondary | ICD-10-CM | POA: Diagnosis not present

## 2018-06-18 DIAGNOSIS — Z1211 Encounter for screening for malignant neoplasm of colon: Secondary | ICD-10-CM

## 2018-06-18 NOTE — Progress Notes (Signed)
Subjective:     Leah Foley is a 57 y.o. female and is here for a comprehensive physical exam. The patient reports no problems.  Social History   Socioeconomic History  . Marital status: Married    Spouse name: Leah Foley   . Number of children: 903  . Years of education: 12  . Highest education level: Not on file  Occupational History  . Occupation: HOMEMAKER   Social Needs  . Financial resource strain: Not on file  . Food insecurity:    Worry: Not on file    Inability: Not on file  . Transportation needs:    Medical: Not on file    Non-medical: Not on file  Tobacco Use  . Smoking status: Never Smoker  . Smokeless tobacco: Never Used  Substance and Sexual Activity  . Alcohol use: No  . Drug use: No  . Sexual activity: Yes    Comment: married, 1 partner  Lifestyle  . Physical activity:    Days per week: Not on file    Minutes per session: Not on file  . Stress: Not on file  Relationships  . Social connections:    Talks on phone: Not on file    Gets together: Not on file    Attends religious service: Not on file    Active member of club or organization: Not on file    Attends meetings of clubs or organizations: Not on file    Relationship status: Not on file  . Intimate partner violence:    Fear of current or ex partner: Not on file    Emotionally abused: Not on file    Physically abused: Not on file    Forced sexual activity: Not on file  Other Topics Concern  . 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     Occupation:  Agricultural engineer    Education:  Designer, industrial/product in [place] Use/Exposure:  None    Alcohol Use:  Occasional   Drug Use:  None   Diet:  Regular   Exercise:  Cardio - 30 minutes daily   Hobbies:  Gardening             Health Maintenance  Topic Date Due  . MAMMOGRAM  05/07/2014  .  COLONOSCOPY  08/08/2015  . PAP SMEAR  09/23/2023 (Originally 01/10/1980)  . HIV Screening  06/05/2026 (Originally 01/09/1976)  . TETANUS/TDAP  03/19/2023  . INFLUENZA VACCINE  Completed  . Hepatitis C Screening  Completed    The following portions of the patient's history were reviewed and updated as appropriate: allergies, current medications, past family history, past medical history, past social history, past surgical history and problem list.  Review of Systems A comprehensive review of systems was negative.   Objective:    BP 104/61   Pulse 71   Ht 5\' 6"  (1.676 m)   Wt 173 lb (78.5 kg)   BMI 27.92 kg/m  General appearance: alert, cooperative and appears stated age Head: Normocephalic, without obvious abnormality, atraumatic Eyes: conjunctivae/corneas clear. PERRL, EOM's intact. Fundi benign. Ears: normal TM's and external ear canals both ears cerumen noted in both ears.  Nose: Nares normal. Septum midline. Mucosa normal. No drainage or sinus tenderness. Throat: lips, mucosa, and tongue normal; teeth and gums normal Neck: no  adenopathy, no carotid bruit, no JVD, supple, symmetrical, trachea midline and thyroid not enlarged, symmetric, no tenderness/mass/nodules Back: symmetric, no curvature. ROM normal. No CVA tenderness. Lungs: clear to auscultation bilaterally Heart: regular rate and rhythm, S1, S2 normal, no murmur, click, rub or gallop Abdomen: soft, non-tender; bowel sounds normal; no masses,  no organomegaly Extremities: extremities normal, atraumatic, no cyanosis or edema and right shoulder decreased ROM due to pain.  Pulses: 2+ and symmetric Skin: Skin color, texture, turgor normal. No rashes or lesions Lymph nodes: Cervical, supraclavicular, and axillary nodes normal. Neurologic: Alert and oriented X 3, normal strength and tone. Normal symmetric reflexes. Normal coordination and gait    Assessment:    Healthy female exam.     Plan:  Marland KitchenMarland KitchenMaurisha was seen today for  annual exam.  Diagnoses and all orders for this visit:  Routine physical examination -     Lipid Panel w/reflex Direct LDL -     COMPLETE METABOLIC PANEL WITH GFR  Colon cancer screening -     Ambulatory referral to Gastroenterology  Screening for lipid disorders -     Lipid Panel w/reflex Direct LDL  Screening for diabetes mellitus -     COMPLETE METABOLIC PANEL WITH GFR   .Marland Kitchen Depression screen [facility] 2/9 06/21/2018 09/28/2017 03/07/2017 02/20/2013  Decreased Interest 0 0 0 0  Down, Depressed, Hopeless 0 0 0 0  PHQ - 2 Score 0 0 0 0   .Marland Kitchen Discussed 150 minutes of exercise a week.  Encouraged vitamin D 1000 units and Calcium 1300mg  or 4 servings of dairy a day.  Fasting labs ordered.  Colonoscopy ordered.   Pt does not want traditional screening mammogram. She is only interested in ultrasound. Will find out if insurance will pay for this.   Pt has dx of Bicipital tendonitis of right shoulder as well as right supraspinatus. She is being followed by ortho. Encouraged patient to keep right shoulder moving as to not develop frozen shoulder.    See After Visit Summary for Counseling Recommendations

## 2018-06-18 NOTE — Patient Instructions (Signed)
Health Maintenance for Postmenopausal Women Menopause is a normal process in which your reproductive ability comes to an end. This process happens gradually over a span of months to years, usually between the ages of 22 and 9. Menopause is complete when you have missed 12 consecutive menstrual periods. It is important to talk with your health care provider about some of the most common conditions that affect postmenopausal women, such as heart disease, cancer, and bone loss (osteoporosis). Adopting a healthy lifestyle and getting preventive care can help to promote your health and wellness. Those actions can also lower your chances of developing some of these common conditions. What should I know about menopause? During menopause, you may experience a number of symptoms, such as:  Moderate-to-severe hot flashes.  Night sweats.  Decrease in sex drive.  Mood swings.  Headaches.  Tiredness.  Irritability.  Memory problems.  Insomnia.  Choosing to treat or not to treat menopausal changes is an individual decision that you make with your health care provider. What should I know about hormone replacement therapy and supplements? Hormone therapy products are effective for treating symptoms that are associated with menopause, such as hot flashes and night sweats. Hormone replacement carries certain risks, especially as you become older. If you are thinking about using estrogen or estrogen with progestin treatments, discuss the benefits and risks with your health care provider. What should I know about heart disease and stroke? Heart disease, heart attack, and stroke become more likely as you age. This may be due, in part, to the hormonal changes that your body experiences during menopause. These can affect how your body processes dietary fats, triglycerides, and cholesterol. Heart attack and stroke are both medical emergencies. There are many things that you can do to help prevent heart disease  and stroke:  Have your blood pressure checked at least every 1-2 years. High blood pressure causes heart disease and increases the risk of stroke.  If you are 53-22 years old, ask your health care provider if you should take aspirin to prevent a heart attack or a stroke.  Do not use any tobacco products, including cigarettes, chewing tobacco, or electronic cigarettes. If you need help quitting, ask your health care provider.  It is important to eat a healthy diet and maintain a healthy weight. ? Be sure to include plenty of vegetables, fruits, low-fat dairy products, and lean protein. ? Avoid eating foods that are high in solid fats, added sugars, or salt (sodium).  Get regular exercise. This is one of the most important things that you can do for your health. ? Try to exercise for at least 150 minutes each week. The type of exercise that you do should increase your heart rate and make you sweat. This is known as moderate-intensity exercise. ? Try to do strengthening exercises at least twice each week. Do these in addition to the moderate-intensity exercise.  Know your numbers.Ask your health care provider to check your cholesterol and your blood glucose. Continue to have your blood tested as directed by your health care provider.  What should I know about cancer screening? There are several types of cancer. Take the following steps to reduce your risk and to catch any cancer development as early as possible. Breast Cancer  Practice breast self-awareness. ? This means understanding how your breasts normally appear and feel. ? It also means doing regular breast self-exams. Let your health care provider know about any changes, no matter how small.  If you are 40  or older, have a clinician do a breast exam (clinical breast exam or CBE) every year. Depending on your age, family history, and medical history, it may be recommended that you also have a yearly breast X-ray (mammogram).  If you  have a family history of breast cancer, talk with your health care provider about genetic screening.  If you are at high risk for breast cancer, talk with your health care provider about having an MRI and a mammogram every year.  Breast cancer (BRCA) gene test is recommended for women who have family members with BRCA-related cancers. Results of the assessment will determine the need for genetic counseling and BRCA1 and for BRCA2 testing. BRCA-related cancers include these types: ? Breast. This occurs in males or females. ? Ovarian. ? Tubal. This may also be called fallopian tube cancer. ? Cancer of the abdominal or pelvic lining (peritoneal cancer). ? Prostate. ? Pancreatic.  Cervical, Uterine, and Ovarian Cancer Your health care provider may recommend that you be screened regularly for cancer of the pelvic organs. These include your ovaries, uterus, and vagina. This screening involves a pelvic exam, which includes checking for microscopic changes to the surface of your cervix (Pap test).  For women ages 21-65, health care providers may recommend a pelvic exam and a Pap test every three years. For women ages 79-65, they may recommend the Pap test and pelvic exam, combined with testing for human papilloma virus (HPV), every five years. Some types of HPV increase your risk of cervical cancer. Testing for HPV may also be done on women of any age who have unclear Pap test results.  Other health care providers may not recommend any screening for nonpregnant women who are considered low risk for pelvic cancer and have no symptoms. Ask your health care provider if a screening pelvic exam is right for you.  If you have had past treatment for cervical cancer or a condition that could lead to cancer, you need Pap tests and screening for cancer for at least 20 years after your treatment. If Pap tests have been discontinued for you, your risk factors (such as having a new sexual partner) need to be  reassessed to determine if you should start having screenings again. Some women have medical problems that increase the chance of getting cervical cancer. In these cases, your health care provider may recommend that you have screening and Pap tests more often.  If you have a family history of uterine cancer or ovarian cancer, talk with your health care provider about genetic screening.  If you have vaginal bleeding after reaching menopause, tell your health care provider.  There are currently no reliable tests available to screen for ovarian cancer.  Lung Cancer Lung cancer screening is recommended for adults 69-62 years old who are at high risk for lung cancer because of a history of smoking. A yearly low-dose CT scan of the lungs is recommended if you:  Currently smoke.  Have a history of at least 30 pack-years of smoking and you currently smoke or have quit within the past 15 years. A pack-year is smoking an average of one pack of cigarettes per day for one year.  Yearly screening should:  Continue until it has been 15 years since you quit.  Stop if you develop a health problem that would prevent you from having lung cancer treatment.  Colorectal Cancer  This type of cancer can be detected and can often be prevented.  Routine colorectal cancer screening usually begins at  age 42 and continues through age 45.  If you have risk factors for colon cancer, your health care provider may recommend that you be screened at an earlier age.  If you have a family history of colorectal cancer, talk with your health care provider about genetic screening.  Your health care provider may also recommend using home test kits to check for hidden blood in your stool.  A small camera at the end of a tube can be used to examine your colon directly (sigmoidoscopy or colonoscopy). This is done to check for the earliest forms of colorectal cancer.  Direct examination of the colon should be repeated every  5-10 years until age 71. However, if early forms of precancerous polyps or small growths are found or if you have a family history or genetic risk for colorectal cancer, you may need to be screened more often.  Skin Cancer  Check your skin from head to toe regularly.  Monitor any moles. Be sure to tell your health care provider: ? About any new moles or changes in moles, especially if there is a change in a mole's shape or color. ? If you have a mole that is larger than the size of a pencil eraser.  If any of your family members has a history of skin cancer, especially at a young age, talk with your health care provider about genetic screening.  Always use sunscreen. Apply sunscreen liberally and repeatedly throughout the day.  Whenever you are outside, protect yourself by wearing long sleeves, pants, a wide-brimmed hat, and sunglasses.  What should I know about osteoporosis? Osteoporosis is a condition in which bone destruction happens more quickly than new bone creation. After menopause, you may be at an increased risk for osteoporosis. To help prevent osteoporosis or the bone fractures that can happen because of osteoporosis, the following is recommended:  If you are 46-71 years old, get at least 1,000 mg of calcium and at least 600 mg of vitamin D per day.  If you are older than age 55 but younger than age 65, get at least 1,200 mg of calcium and at least 600 mg of vitamin D per day.  If you are older than age 54, get at least 1,200 mg of calcium and at least 800 mg of vitamin D per day.  Smoking and excessive alcohol intake increase the risk of osteoporosis. Eat foods that are rich in calcium and vitamin D, and do weight-bearing exercises several times each week as directed by your health care provider. What should I know about how menopause affects my mental health? Depression may occur at any age, but it is more common as you become older. Common symptoms of depression  include:  Low or sad mood.  Changes in sleep patterns.  Changes in appetite or eating patterns.  Feeling an overall lack of motivation or enjoyment of activities that you previously enjoyed.  Frequent crying spells.  Talk with your health care provider if you think that you are experiencing depression. What should I know about immunizations? It is important that you get and maintain your immunizations. These include:  Tetanus, diphtheria, and pertussis (Tdap) booster vaccine.  Influenza every year before the flu season begins.  Pneumonia vaccine.  Shingles vaccine.  Your health care provider may also recommend other immunizations. This information is not intended to replace advice given to you by your health care provider. Make sure you discuss any questions you have with your health care provider. Document Released: 09/15/2005  Document Revised: 02/11/2016 Document Reviewed: 04/27/2015 Elsevier Interactive Patient Education  2018 [employer]  

## 2018-06-19 LAB — COMPLETE METABOLIC PANEL WITH GFR
AG Ratio: 1.7 (calc) (ref 1.0–2.5)
ALBUMIN MSPROF: 4.4 g/dL (ref 3.6–5.1)
ALT: 16 U/L (ref 6–29)
AST: 16 U/L (ref 10–35)
Alkaline phosphatase (APISO): 81 U/L (ref 33–130)
BUN: 10 mg/dL (ref 7–25)
CALCIUM: 9.7 mg/dL (ref 8.6–10.4)
CO2: 29 mmol/L (ref 20–32)
Chloride: 103 mmol/L (ref 98–110)
Creat: 0.68 mg/dL (ref 0.50–1.05)
GFR, EST AFRICAN AMERICAN: 111 mL/min/{1.73_m2} (ref >=60)
GFR, EST NON AFRICAN AMERICAN: 96 mL/min/{1.73_m2} (ref >=60)
GLUCOSE: 93 mg/dL (ref 65–99)
Globulin: 2.6 g/dL (calc) (ref 1.9–3.7)
Potassium: 4.1 mmol/L (ref 3.5–5.3)
Sodium: 141 mmol/L (ref 135–146)
TOTAL PROTEIN: 7 g/dL (ref 6.1–8.1)
Total Bilirubin: 1 mg/dL (ref 0.2–1.2)

## 2018-06-19 LAB — LIPID PANEL W/REFLEX DIRECT LDL
Cholesterol: 260 mg/dL — ABNORMAL HIGH (ref <200)
HDL: 79 mg/dL (ref >50)
LDL Cholesterol (Calc): 158 mg/dL (calc) — ABNORMAL HIGH
NON-HDL CHOLESTEROL (CALC): 181 mg/dL — AB (ref <130)
Total CHOL/HDL Ratio: 3.3 (calc) (ref <5.0)
Triglycerides: 111 mg/dL (ref <150)

## 2018-06-19 NOTE — Progress Notes (Signed)
Call pt: kidney, liver, glucose looks great. HDL wonderful. LDL up some. Overall 10 year CV risk is 2.1 percent under the guidelines to treat with medication which is 7.5 percent. Keep watching fatty/processed foods in diet and stay active.

## 2018-06-21 ENCOUNTER — Telehealth: Payer: Self-pay | Admitting: Physician Assistant

## 2018-06-21 NOTE — Telephone Encounter (Signed)
Patient can call insurance and see what they will pay for.

## 2018-06-21 NOTE — Telephone Encounter (Signed)
I spoke with Apolonio Schneiders in imaging and she stated there is not an order for screening US. FYI.

## 2018-06-21 NOTE — Telephone Encounter (Signed)
Can patient pay out of pocket for this? She wants an ultrasound but looks like insurance will not pay. If so how much?

## 2018-06-21 NOTE — Telephone Encounter (Signed)
Pt does not want mammogram due to radiation. Can we find out if imaging will do screening ultrasound for patient?

## 2018-12-24 ENCOUNTER — Ambulatory Visit: Payer: Commercial Managed Care - PPO | Admitting: Family Medicine

## 2018-12-24 NOTE — Progress Notes (Deleted)
   Leah Foley is a 58 y.o. female who presents to [facility] today for ***  Patient has a history of persistent right elbow pain in 2019.  She had MRI and July which showed partial tear of the common extensor tendon as well as severe tendinosis.  ROS:  As above  Exam:  There were no vitals taken for this visit. Wt Readings from Last 5 Encounters:  06/18/18 173 lb (78.5 kg)  06/02/18 175 lb (79.4 kg)  02/11/18 176 lb (79.8 kg)  01/22/18 176 lb 9.6 oz (80.1 kg)  01/10/18 176 lb (79.8 kg)   General: Well Developed, well nourished, and in no acute distress.  Neuro/Psych: Alert and oriented x3, extra-ocular muscles intact, able to move all 4 extremities, sensation grossly intact. Skin: Warm and dry, no rashes noted.  Respiratory: Not using accessory muscles, speaking in full sentences, trachea midline.  Cardiovascular: Pulses palpable, no extremity edema. Abdomen: Does not appear distended. MSK: ***    Lab and Radiology Results No results found for this or any previous visit (from the past 72 hour(s)). No results found.     Assessment and Plan: 58 y.o. female with ***   PDMP not reviewed this encounter. No orders of the defined types were placed in this encounter.  No orders of the defined types were placed in this encounter.   Historical information moved to improve visibility of documentation.  Past Medical History:  Diagnosis Date  . Allergy   . 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 ABDOMINAL HYSTERECTOMY    . APPENDECTOMY     Social History   Tobacco Use  . Smoking status: Never Smoker  . Smokeless tobacco: Never Used  Substance Use Topics  . Alcohol use: No   family history includes Cancer in her father; Kidney disease in her maternal grandfather; Lupus in her mother.  Medications: Current Outpatient Medications  Medication Sig Dispense Refill  . ibuprofen  (ADVIL,MOTRIN) 200 MG tablet Take 200 mg by mouth every 6 (six) hours as needed.    Marland Kitchen LORazepam (ATIVAN) 0.5 MG tablet 1-2 tabs 30 - 60 min prior to MRI. Do not drive with this medicine. (Patient not taking: Reported on 06/18/2018) 4 tablet 0  . Multiple Vitamin (MULTIVITAMIN) tablet Take 1 tablet by mouth daily.    Marland Kitchen PAZEO 0.7 % SOLN      No current facility-administered medications for this visit.    Allergies  Allergen Reactions  . Azithromycin Palpitations  . Sulfa Antibiotics Rash      Discussed warning signs or symptoms. Please see discharge instructions. Patient expresses understanding.

## 2019-01-24 IMAGING — DX DG ELBOW COMPLETE 3+V*R*
4 series · 4 of 4 positions shown · non-contrast
Comparison: None.

CLINICAL DATA: Right elbow pain after fall last evening.

EXAM:
RIGHT ELBOW - COMPLETE 3+ VIEW

[elbow ap]
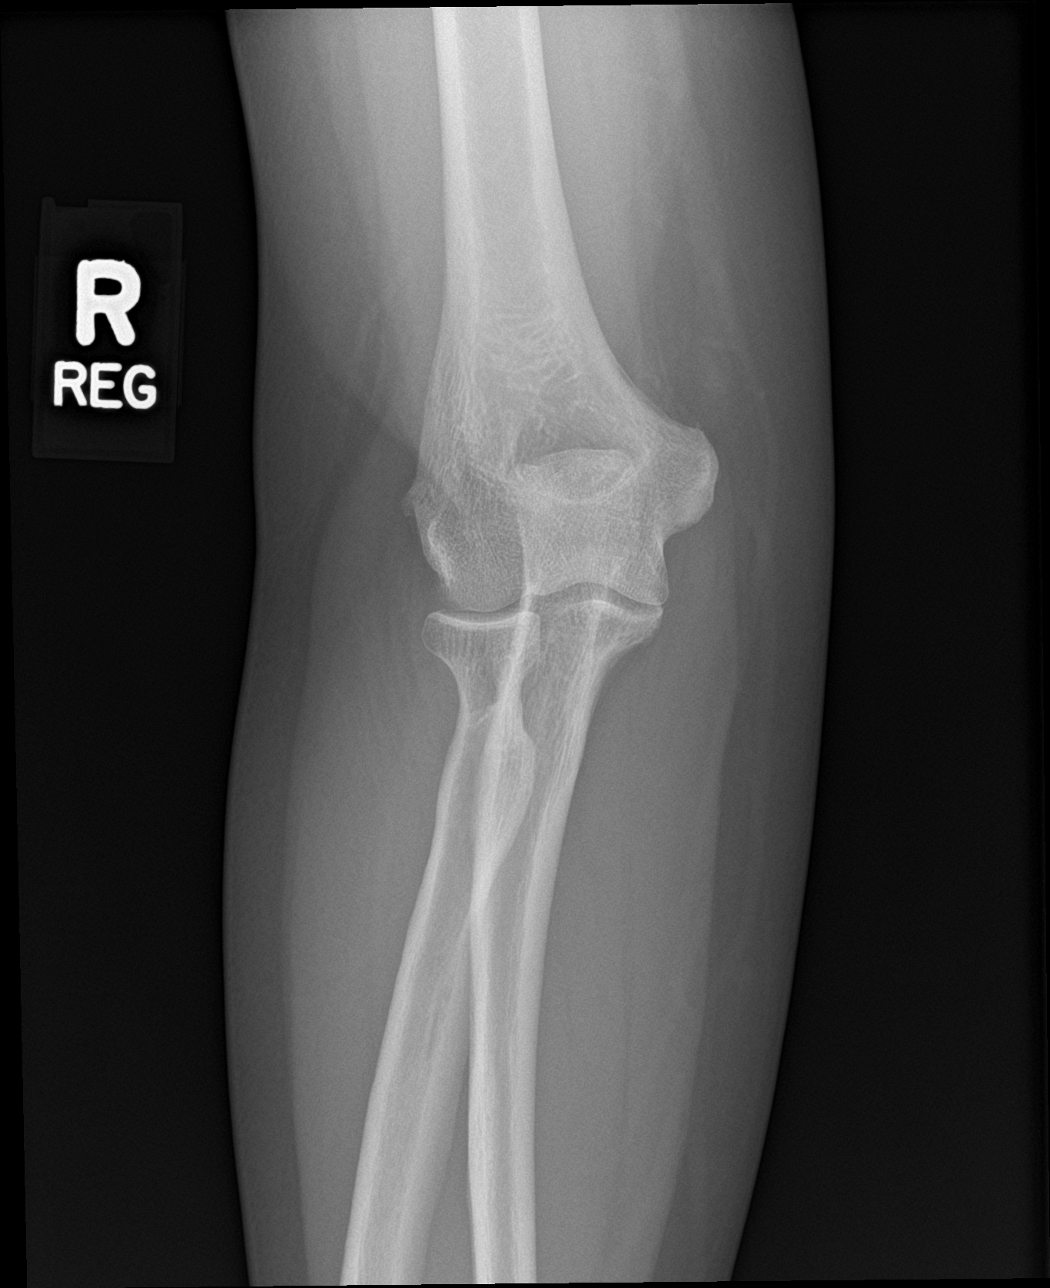

[elbow obl (1 of 2)]
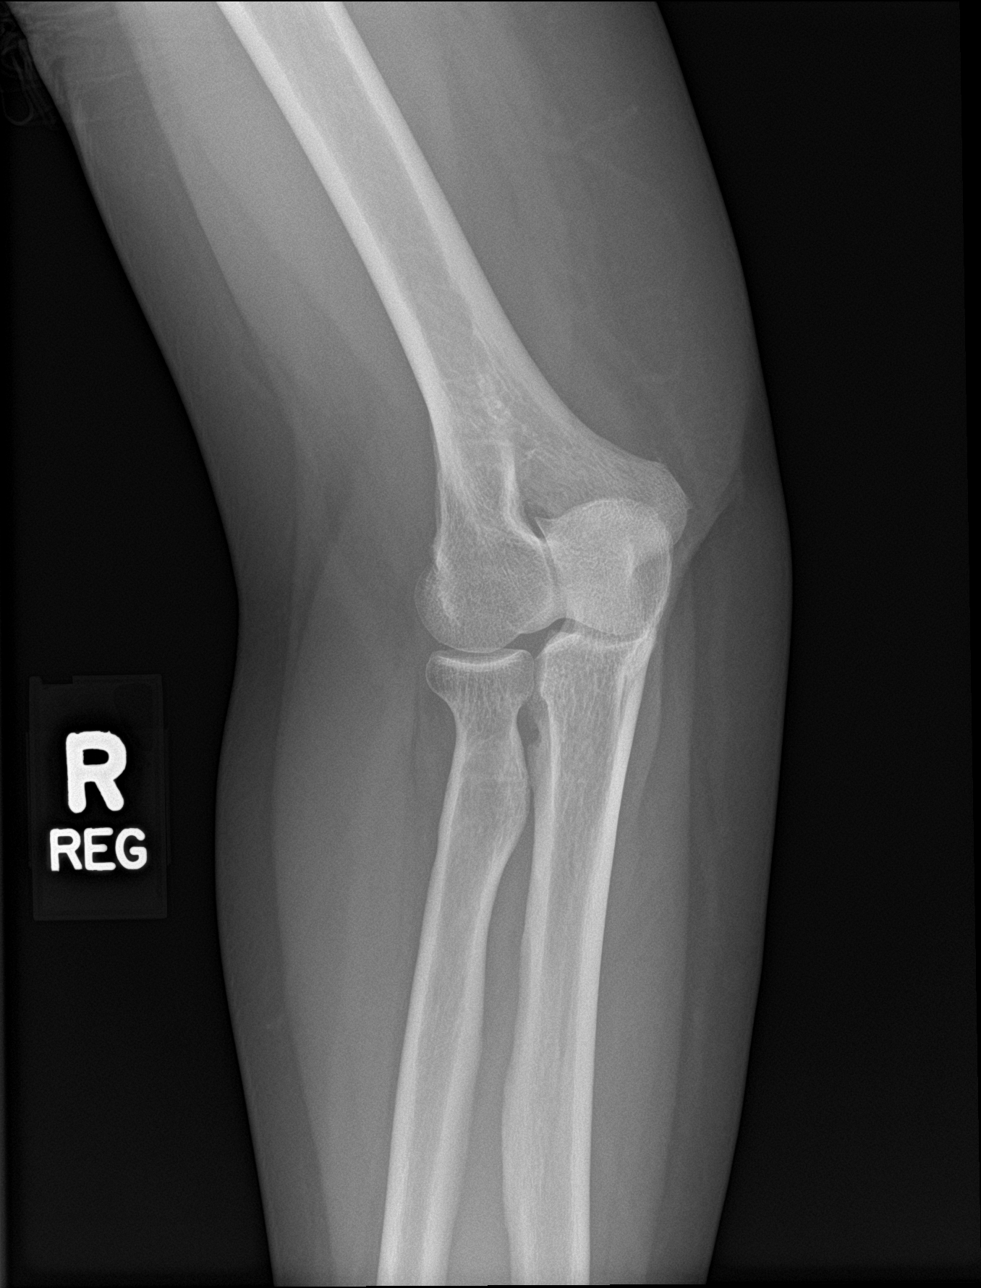

[elbow lat]
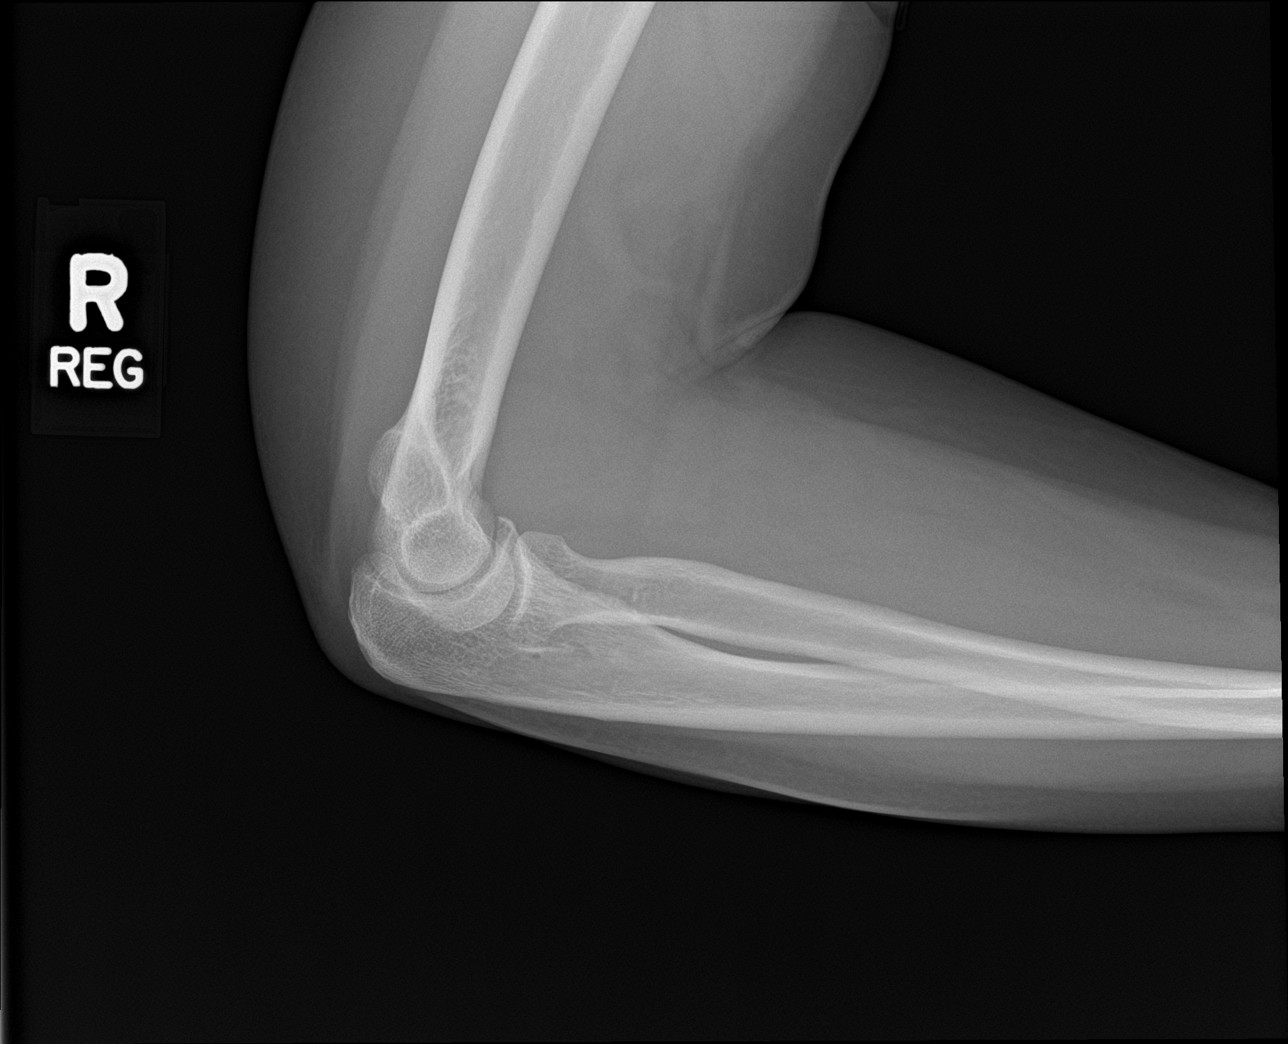

[elbow obl (2 of 2)]
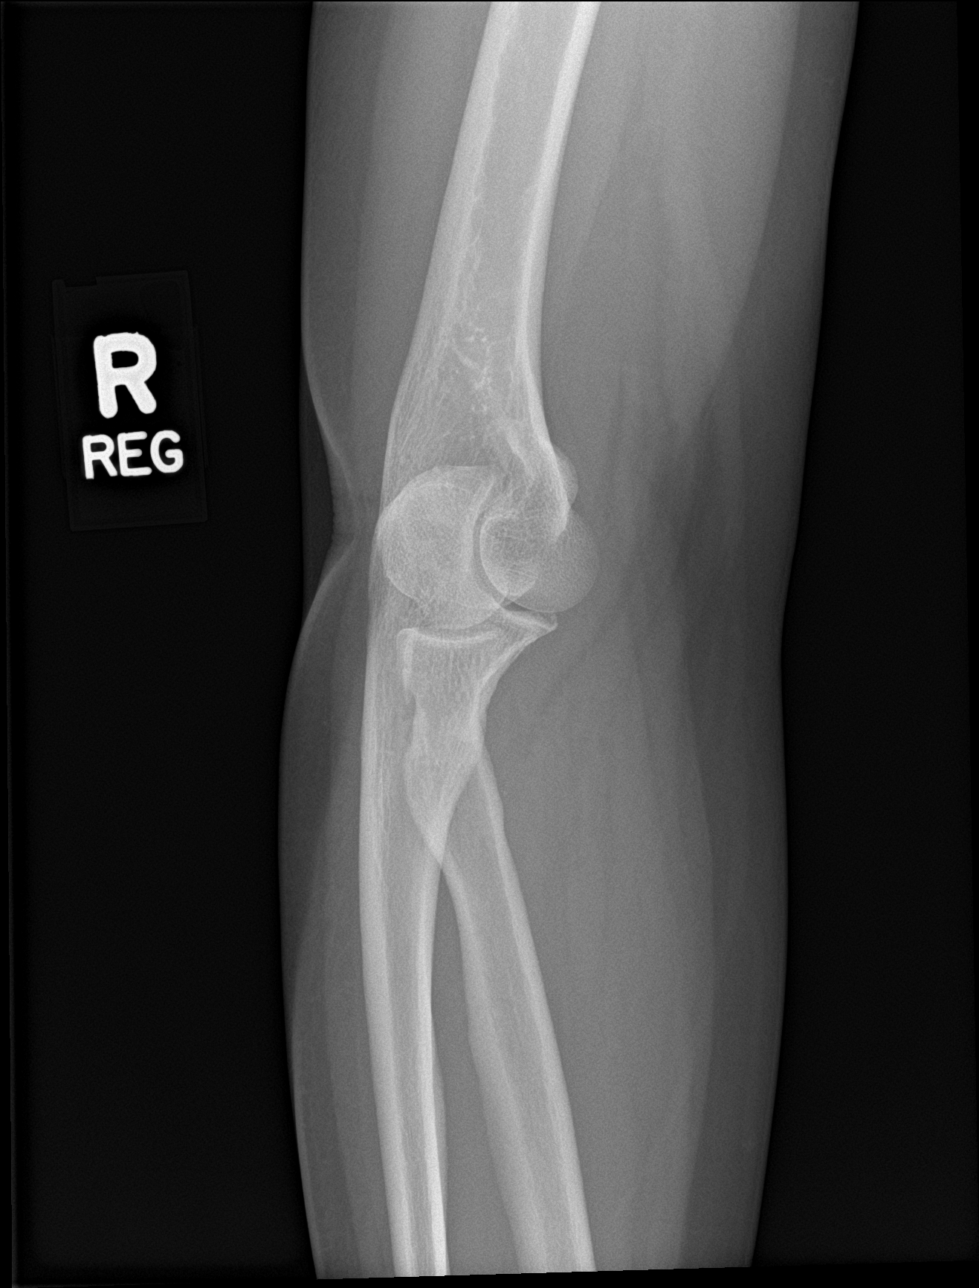

[4 of 4 positions shown; findings below may reference images not displayed]

FINDINGS: There is no evidence of fracture, dislocation, or joint effusion.
Minimal spurring off the humeral epicondyles may reflect stigmata of
old epicondylitis. No intra-articular loose bodies. Soft tissues are
unremarkable.
IMPRESSION: No acute fracture, dislocation or joint effusion of the right elbow.
Minimal spurring off the epicondyles may reflect stigmata of
bilateral old epicondylitis.

## 2019-11-15 IMAGING — DX DG ELBOW COMPLETE 3+V*R*
4 series · 4 of 4 positions shown · non-contrast
Comparison: None.

CLINICAL DATA: Right elbow pain since a fall 2 weeks ago. Initial
encounter.

EXAM:
RIGHT ELBOW - COMPLETE 3+ VIEW

[elbow ap]
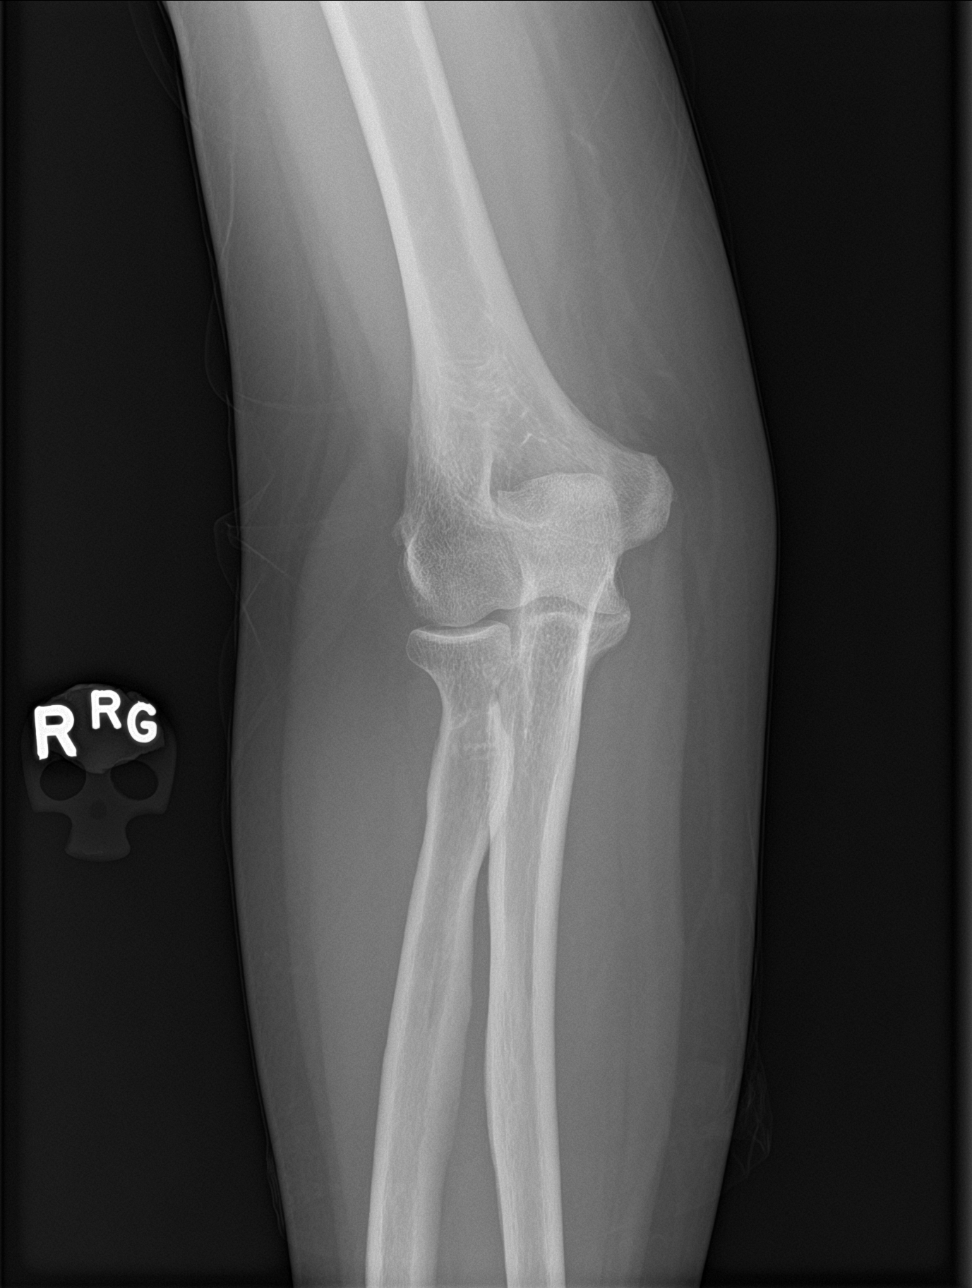

[elbow obl (1 of 2)]
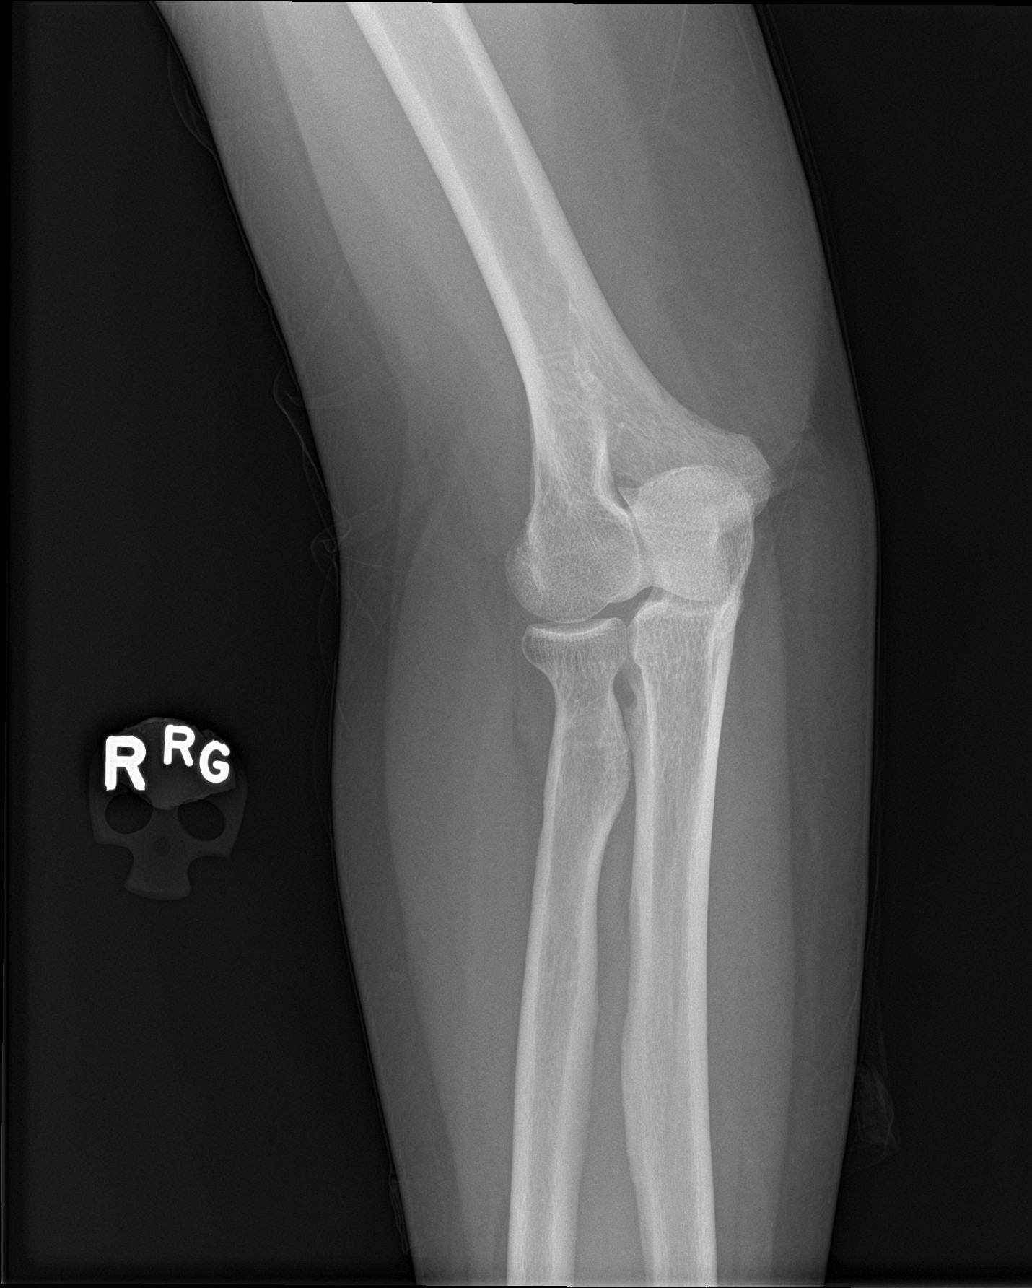

[elbow lat]
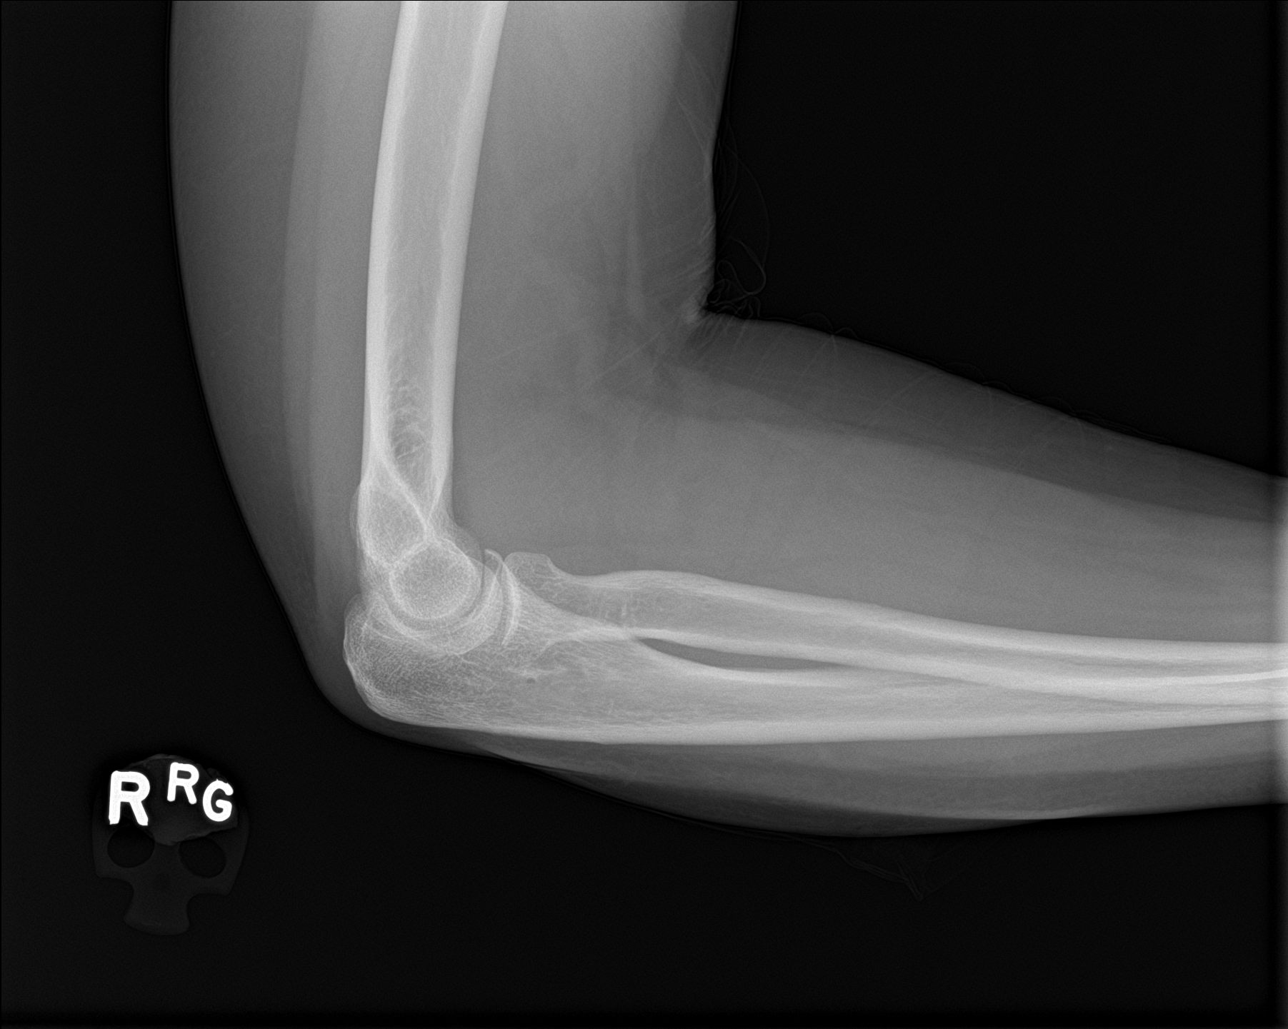

[elbow obl (2 of 2)]
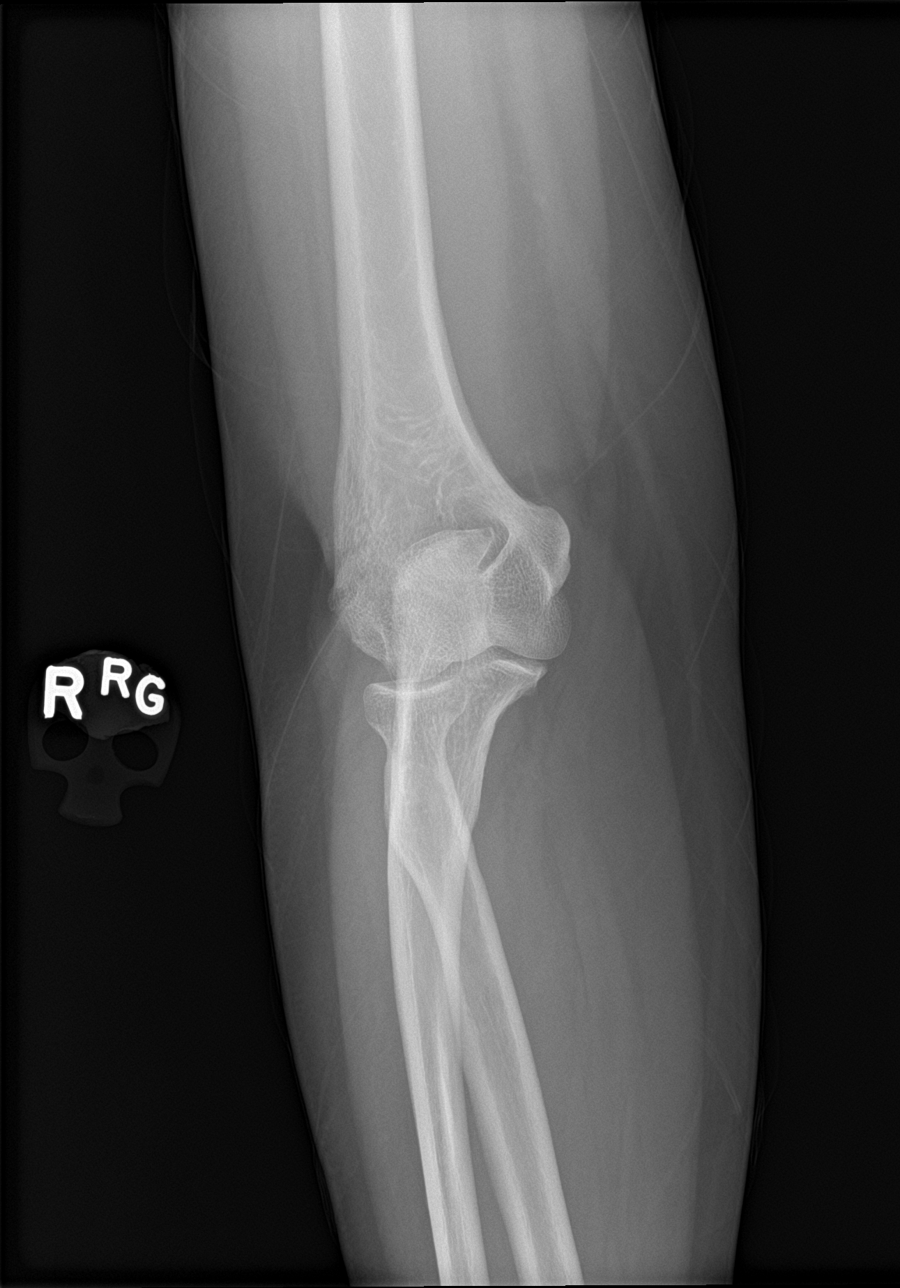

[4 of 4 positions shown; findings below may reference images not displayed]

FINDINGS: There is no evidence of fracture, dislocation, or joint effusion.
There is no evidence of arthropathy or other focal bone abnormality.
Soft tissues are unremarkable.
IMPRESSION: Negative exam.

## 2020-08-04 ENCOUNTER — Other Ambulatory Visit: Payer: Self-pay

## 2020-08-04 ENCOUNTER — Emergency Department
Admission: EM | Admit: 2020-08-04 | Discharge: 2020-08-04 | Disposition: A | Discharge status: Home / Self Care | Payer: Commercial Managed Care - PPO | Source: Home / Self Care

## 2020-08-04 DIAGNOSIS — J0100 Acute maxillary sinusitis, unspecified: Secondary | ICD-10-CM

## 2020-08-04 DIAGNOSIS — R059 Cough, unspecified: Secondary | ICD-10-CM

## 2020-08-04 MED ORDER — AMOXICILLIN 875 MG PO TABS: 875.0000 mg | ORAL_TABLET | Freq: Two times a day (BID) | ORAL | 0 refills | Status: DC

## 2020-08-04 NOTE — ED Provider Notes (Signed)
[facility]    CSN: [id number] Arrival date & time: 08/04/20  5732      History   Chief Complaint Chief Complaint  Patient presents with  . Nasal Congestion  . Cough    HPI Leah Foley is a 59 y.o. female.   Established [facility] patient  Pt c/o cough and congestion x 10 days. Started as sore throat which has resolved. Low grade fever. Tylenol cold and flu prn. And Mucinex prn. No known covid exposure. Kids from [place] visited and were sick as well. Has not been vaccinated.     Past Medical History:  Diagnosis Date  . Allergy   . 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 Lateral epicondylitis of right elbow 02/11/2018  . Partial tear of common extensor tendon of elbow 02/11/2018  . Dry eyes, bilateral 12/19/2017  . 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 No energy 12/13/2017  . Acute non-recurrent ethmoidal sinusitis 07/21/2016  . Single skin nodule 06/06/2016  . Left knee pain 11/08/2015  . Difficulty sleeping 09/22/2013  . Need for prophylactic vaccination with combined diphtheria-tetanus-pertussis (DTP) vaccine 05/03/2013  . Other and unspecified hyperlipidemia 05/03/2013  . Allergic rhinitis 03/18/2013  . Routine general medical examination at a health care facility 03/18/2013  . Lipoma 03/18/2013    Past Surgical History:  Procedure Laterality Date  . ABDOMINAL HYSTERECTOMY    . APPENDECTOMY      OB History   No obstetric history on file.      Home Medications    Prior to Admission medications   Medication Sig Start Date End Date Taking? Authorizing Provider  ibuprofen (ADVIL,MOTRIN) 200 MG tablet Take 200 mg by mouth every 6 (six) hours as needed.    [provider]  LORazepam (ATIVAN) 0.5 MG tablet 1-2 tabs 30 - 60 min prior to MRI. Do not drive with this medicine. Patient not taking: Reported on 06/18/2018 01/10/18   Rodolph Bong, MD  Multiple Vitamin  (MULTIVITAMIN) tablet Take 1 tablet by mouth daily.    [provider]  PAZEO 0.7 % SOLN  12/13/17   [provider]    Family History Family History  Problem Relation Age of Onset  . Lupus Mother   . Cancer Father   . Kidney disease Maternal Grandfather     Social History Social History   Tobacco Use  . Smoking status: Never Smoker  . Smokeless tobacco: Never Used  Substance Use Topics  . Alcohol use: No  . Drug use: No     Allergies   Azithromycin and Sulfa antibiotics   Review of Systems Review of Systems  HENT: Positive for congestion.   Respiratory: Positive for cough.      Physical Exam Triage Vital Signs ED Triage Vitals  Enc Vitals Group     BP 08/04/20 0957 (!) 146/90     Pulse Rate 08/04/20 0957 (!) 103     Resp 08/04/20 0957 18     Temp 08/04/20 0957 98.9 F (37.2 C)     Temp Source 08/04/20 0957 Oral     SpO2 08/04/20 0957 96 %     Weight --      Height --      Head Circumference --      Peak Flow --      Pain Score 08/04/20 0958 0     Pain Loc --      Pain  Edu? --      Excl. in [place]? --    No data found.  Updated Vital Signs BP (!) 146/90 (BP Location: Right Arm)   Pulse (!) 103   Temp 98.9 F (37.2 C) (Oral)   Resp 18   SpO2 96%   Physical Exam Vitals and nursing note reviewed.  Constitutional:      Appearance: Normal appearance. She is normal weight. She is not ill-appearing.  HENT:     Head: Normocephalic.     Nose: Congestion present.     Mouth/Throat:     Mouth: Mucous membranes are moist.  Eyes:     Conjunctiva/sclera: Conjunctivae normal.  Cardiovascular:     Rate and Rhythm: Normal rate.     Pulses: Normal pulses.  Pulmonary:     Effort: Pulmonary effort is normal.     Breath sounds: Normal breath sounds.  Musculoskeletal:        General: Normal range of motion.     Cervical back: Normal range of motion and neck supple.  Skin:    General: Skin is warm and dry.  Neurological:     General: No  focal deficit present.     Mental Status: She is alert.  Psychiatric:        Mood and Affect: Mood normal.      UC Treatments / Results  Labs (all labs ordered are listed, but only abnormal results are displayed) Labs Reviewed - No data to display  EKG   Radiology No results found.  Procedures Procedures (including critical care time)  Medications Ordered in UC Medications - No data to display  Initial Impression / Assessment and Plan / UC Course  I have reviewed the triage vital signs and the nursing notes.  Pertinent labs & imaging results that were available during my care of the patient were reviewed by me and considered in my medical decision making (see chart for details).    Final Clinical Impressions(s) / UC Diagnoses   Final diagnoses:  None   Discharge Instructions   None    ED Prescriptions    None     PDMP not reviewed this encounter.   Robyn Haber, MD 08/04/20 1029

## 2020-08-04 NOTE — ED Triage Notes (Signed)
Pt c/o cough and congestion x 10 days. Started as sore throat which has resolved. Low grade fever. Tylenol cold and flu prn. And Mucinex prn. No known covid exposure. Kids from [place] visited and were sick as well. Has not been vaccinated.

## 2020-08-06 LAB — COVID-19, FLU A+B AND RSV
Influenza A, NAA: NOT DETECTED
Influenza B, NAA: NOT DETECTED
RSV, NAA: NOT DETECTED
SARS-CoV-2, NAA: NOT DETECTED

## 2021-06-02 ENCOUNTER — Ambulatory Visit (INDEPENDENT_AMBULATORY_CARE_PROVIDER_SITE_OTHER): Payer: Commercial Managed Care - PPO | Admitting: Physician Assistant

## 2021-06-02 ENCOUNTER — Encounter: Payer: Self-pay | Admitting: Physician Assistant

## 2021-06-02 ENCOUNTER — Other Ambulatory Visit: Payer: Self-pay

## 2021-06-02 VITALS — BP 132/70 | HR 87 | Ht 66.0 in | Wt 180.0 lb

## 2021-06-02 DIAGNOSIS — Z23 Encounter for immunization: Secondary | ICD-10-CM

## 2021-06-02 DIAGNOSIS — Z1231 Encounter for screening mammogram for malignant neoplasm of breast: Secondary | ICD-10-CM

## 2021-06-02 DIAGNOSIS — Z1329 Encounter for screening for other suspected endocrine disorder: Secondary | ICD-10-CM

## 2021-06-02 DIAGNOSIS — Z1211 Encounter for screening for malignant neoplasm of colon: Secondary | ICD-10-CM

## 2021-06-02 DIAGNOSIS — Z131 Encounter for screening for diabetes mellitus: Secondary | ICD-10-CM | POA: Diagnosis not present

## 2021-06-02 DIAGNOSIS — Z0000 Encounter for general adult medical examination without abnormal findings: Secondary | ICD-10-CM | POA: Diagnosis not present

## 2021-06-02 DIAGNOSIS — Z13220 Encounter for screening for lipoid disorders: Secondary | ICD-10-CM

## 2021-06-02 NOTE — Patient Instructions (Signed)
Health Maintenance, Female Adopting a healthy lifestyle and getting preventive care are important in promoting health and wellness. Ask your health care provider about: The right schedule for you to have regular tests and exams. Things you can do on your own to prevent diseases and keep yourself healthy. What should I know about diet, weight, and exercise? Eat a healthy diet  Eat a diet that includes plenty of vegetables, fruits, low-fat dairy products, and lean protein. Do not eat a lot of foods that are high in solid fats, added sugars, or sodium. Maintain a healthy weight Body mass index (BMI) is used to identify weight problems. It estimates body fat based on height and weight. Your health care provider can help determine your BMI and help you achieve or maintain a healthy weight. Get regular exercise Get regular exercise. This is one of the most important things you can do for your health. Most adults should: Exercise for at least 150 minutes each week. The exercise should increase your heart rate and make you sweat (moderate-intensity exercise). Do strengthening exercises at least twice a week. This is in addition to the moderate-intensity exercise. Spend less time sitting. Even light physical activity can be beneficial. Watch cholesterol and blood lipids Have your blood tested for lipids and cholesterol at 62 years of age, then have this test every 5 years. Have your cholesterol levels checked more often if: Your lipid or cholesterol levels are high. You are older than 62 years of age. You are at high risk for heart disease. What should I know about cancer screening? Depending on your health history and family history, you may need to have cancer screening at various ages. This may include screening for: Breast cancer. Cervical cancer. Colorectal cancer. Skin cancer. Lung cancer. What should I know about heart disease, diabetes, and high blood pressure? Blood pressure and heart  disease High blood pressure causes heart disease and increases the risk of stroke. This is more likely to develop in people who have high blood pressure readings, are of African descent, or are overweight. Have your blood pressure checked: Every 3-5 years if you are 18-39 years of age. Every year if you are 40 years old or older. Diabetes Have regular diabetes screenings. This checks your fasting blood sugar level. Have the screening done: Once every three years after age 40 if you are at a normal weight and have a low risk for diabetes. More often and at a younger age if you are overweight or have a high risk for diabetes. What should I know about preventing infection? Hepatitis B If you have a higher risk for hepatitis B, you should be screened for this virus. Talk with your health care provider to find out if you are at risk for hepatitis B infection. Hepatitis C Testing is recommended for: Everyone born from 1945 through 1965. Anyone with known risk factors for hepatitis C. Sexually transmitted infections (STIs) Get screened for STIs, including gonorrhea and chlamydia, if: You are sexually active and are younger than 62 years of age. You are older than 62 years of age and your health care provider tells you that you are at risk for this type of infection. Your sexual activity has changed since you were last screened, and you are at increased risk for chlamydia or gonorrhea. Ask your health care provider if you are at risk. Ask your health care provider about whether you are at high risk for HIV. Your health care provider may recommend a prescription medicine   to help prevent HIV infection. If you choose to take medicine to prevent HIV, you should first get tested for HIV. You should then be tested every 3 months for as long as you are taking the medicine. Pregnancy If you are about to stop having your period (premenopausal) and you may become pregnant, seek counseling before you get  pregnant. Take 400 to 800 micrograms (mcg) of folic acid every day if you become pregnant. Ask for birth control (contraception) if you want to prevent pregnancy. Osteoporosis and menopause Osteoporosis is a disease in which the bones lose minerals and strength with aging. This can result in bone fractures. If you are 65 years old or older, or if you are at risk for osteoporosis and fractures, ask your health care provider if you should: Be screened for bone loss. Take a calcium or vitamin D supplement to lower your risk of fractures. Be given hormone replacement therapy (HRT) to treat symptoms of menopause. Follow these instructions at home: Lifestyle Do not use any products that contain nicotine or tobacco, such as cigarettes, e-cigarettes, and chewing tobacco. If you need help quitting, ask your health care provider. Do not use street drugs. Do not share needles. Ask your health care provider for help if you need support or information about quitting drugs. Alcohol use Do not drink alcohol if: Your health care provider tells you not to drink. You are pregnant, may be pregnant, or are planning to become pregnant. If you drink alcohol: Limit how much you use to 0-1 drink a day. Limit intake if you are breastfeeding. Be aware of how much alcohol is in your drink. In the [place] one drink equals one 12 oz bottle of beer (355 mL), one 5 oz glass of wine (148 mL), or one 1 oz glass of hard liquor (44 mL). General instructions Schedule regular health, dental, and eye exams. Stay current with your vaccines. Tell your health care provider if: You often feel depressed. You have ever been abused or do not feel safe at home. Summary Adopting a healthy lifestyle and getting preventive care are important in promoting health and wellness. Follow your health care provider's instructions about healthy diet, exercising, and getting tested or screened for diseases. Follow your health care provider's  instructions on monitoring your cholesterol and blood pressure. This information is not intended to replace advice given to you by your health care provider. Make sure you discuss any questions you have with your health care provider. Document Revised: 10/01/2020 Document Reviewed: 07/17/2018 Elsevier Patient Education  2022 Elsevier Inc.  

## 2021-06-02 NOTE — Progress Notes (Signed)
Subjective:     Leah Foley is a 60 y.o. female and is here for a comprehensive physical exam. The patient reports no problems.  Social History   Socioeconomic History   Marital status: Married    Spouse name: [name]    Number of children: 20   Years of education: 14   Highest education level: Not on file  Occupational History   Occupation: HOMEMAKER   Tobacco Use   Smoking status: Never   Smokeless tobacco: Never  Substance and Sexual Activity   Alcohol use: No   Drug use: No   Sexual activity: Yes    Comment: married, 1 partner  Other Topics Concern   Not on file  Social History Narrative   Marital Status:  Married Public relations account executive)    Children:  Sons (3) Daughter (1)     Pets: None    Living Situation: Lives with her husband and daughter.     Occupation:  Agricultural engineer    Education:  Designer, industrial/product in [place] Use/Exposure:  None    Alcohol Use:  Occasional   Drug Use:  None   Diet:  Regular   Exercise:  Cardio - 30 minutes daily   Hobbies:  Gardening             Social Determinants of Health   Financial Resource Strain: Not on file  Food Insecurity: Not on file  Transportation Needs: Not on file  Physical Activity: Not on file  Stress: Not on file  Social Connections: Not on file  Intimate Partner Violence: Not on file   Health Maintenance  Topic Date Due   Zoster Vaccines- Shingrix (1 of 2) Never done   MAMMOGRAM  05/07/2014   COLONOSCOPY (Pts 45-54yrs Insurance coverage will need to be confirmed)  08/08/2015   INFLUENZA VACCINE  03/07/2021   PAP SMEAR-Modifier  09/23/2023 (Originally 01/10/1980)   HIV Screening  06/05/2026 (Originally [birth year]/1974)   TETANUS/TDAP  03/19/2023   Hepatitis C Screening  Completed   Pneumococcal Vaccine 52-59 Years old  Aged Out   HPV VACCINES  Aged Out    The following portions of the patient's history were reviewed and updated as appropriate: allergies, current medications, past family history, past medical history,  past social history, past surgical history, and problem list.  Review of Systems A comprehensive review of systems was negative.   Objective:    BP 132/70   Pulse 87   Ht 5\' 6"  (1.676 m)   Wt 180 lb (81.6 kg)   SpO2 98%   BMI 29.05 kg/m  General appearance: alert, cooperative, and appears stated age Head: Normocephalic, without obvious abnormality, atraumatic Eyes: conjunctivae/corneas clear. PERRL, EOM's intact. Fundi benign. Ears: normal TM's and external ear canals both ears Nose: Nares normal. Septum midline. Mucosa normal. No drainage or sinus tenderness. Throat: lips, mucosa, and tongue normal; teeth and gums normal Neck: no adenopathy, no carotid bruit, no JVD, supple, symmetrical, trachea midline, and thyroid not enlarged, symmetric, no tenderness/mass/nodules Back: symmetric, no curvature. ROM normal. No CVA tenderness. Lungs: clear to auscultation bilaterally Heart: regular rate and rhythm, S1, S2 normal, no murmur, click, rub or gallop Abdomen: soft, non-tender; bowel sounds normal; no masses,  no organomegaly Extremities: extremities normal, atraumatic, no cyanosis or edema Pulses: 2+ and symmetric Skin: Skin color, texture, turgor normal. No rashes or lesions Lymph nodes: Cervical, supraclavicular, and axillary nodes normal. Neurologic: Alert and oriented X 3, normal strength and tone. Normal symmetric reflexes. Normal coordination  and gait   .Marland Kitchen Depression screen [facility] 2/9 06/02/2021 06/21/2018 09/28/2017 03/07/2017 02/20/2013  Decreased Interest 0 0 0 0 0  Down, Depressed, Hopeless 0 0 0 0 0  PHQ - 2 Score 0 0 0 0 0    Assessment:    Healthy female exam.      Plan:    Marland KitchenMarland KitchenMorris was seen today for annual exam.  Diagnoses and all orders for this visit:  Routine general medical examination at a health care facility -     TSH -     Lipid Panel w/reflex Direct LDL -     COMPLETE METABOLIC PANEL WITH GFR -     CBC with Differential/Platelet  Lipid screening -      Lipid Panel w/reflex Direct LDL  Diabetes mellitus screening -     COMPLETE METABOLIC PANEL WITH GFR -     Hemoglobin A1c  Thyroid disorder screen -     TSH  Colon cancer screening -     Cologuard  Flu vaccine need -     Flu Vaccine QUAD 20mo+IM (Fluarix, Fluzone & Alfiuria Quad PF)  Visit for screening mammogram -     MM 3D SCREEN BREAST BILATERAL  .Marland Kitchen Discussed 150 minutes of exercise a week.  Encouraged vitamin D 1000 units and Calcium 1300mg  or 4 servings of dairy a day.  PHQ no concerns.  Fasting labs ordered.  Mammogram ordered.  Cologuard ordered.  Declined shingles and covid vaccine.  Flu shot given today.       See After Visit Summary for Counseling Recommendations

## 2021-06-03 NOTE — Progress Notes (Signed)
Surena,   Thyroid looks great.  HDL, good cholesterol, looks amazing.  LDL, bad cholesterol, better than 2019! Great job.  Kidney, liver, glucose look great.  No diabetes.  Hemoglobin a little elevated. Add ferritin and serum iron. Are you taking any iron supplements?

## 2021-06-06 LAB — CBC WITH DIFFERENTIAL/PLATELET
Absolute Monocytes: 407 cells/uL (ref 200–950)
Basophils Absolute: 69 cells/uL (ref 0–200)
Basophils Relative: 1.4 %
Eosinophils Absolute: 147 cells/uL (ref 15–500)
Eosinophils Relative: 3 %
HCT: 47.3 % — ABNORMAL HIGH (ref 35.0–45.0)
Hemoglobin: 15.9 g/dL — ABNORMAL HIGH (ref 11.7–15.5)
Lymphs Abs: 1578 cells/uL (ref 850–3900)
MCH: 30.1 pg (ref 27.0–33.0)
MCHC: 33.6 g/dL (ref 32.0–36.0)
MCV: 89.6 fL (ref 80.0–100.0)
MPV: 9.9 fL (ref 7.5–12.5)
Monocytes Relative: 8.3 %
Neutro Abs: 2700 cells/uL (ref 1500–7800)
Neutrophils Relative %: 55.1 %
Platelets: 269 10*3/uL (ref 140–400)
RBC: 5.28 10*6/uL — ABNORMAL HIGH (ref 3.80–5.10)
RDW: 13 % (ref 11.0–15.0)
Total Lymphocyte: 32.2 %
WBC: 4.9 10*3/uL (ref 3.8–10.8)

## 2021-06-06 LAB — IRON,TIBC AND FERRITIN PANEL
%SAT: 27 % (calc) (ref 16–45)
Ferritin: 107 ng/mL (ref 16–288)
Iron: 103 ug/dL (ref 45–160)
TIBC: 375 mcg/dL (calc) (ref 250–450)

## 2021-06-06 LAB — LIPID PANEL W/REFLEX DIRECT LDL
Cholesterol: 243 mg/dL — ABNORMAL HIGH (ref <200)
HDL: 79 mg/dL (ref >=50)
LDL Cholesterol (Calc): 137 mg/dL (calc) — ABNORMAL HIGH
Non-HDL Cholesterol (Calc): 164 mg/dL (calc) — ABNORMAL HIGH (ref <130)
Total CHOL/HDL Ratio: 3.1 (calc) (ref <5.0)
Triglycerides: 143 mg/dL (ref <150)

## 2021-06-06 LAB — COMPLETE METABOLIC PANEL WITH GFR
AG Ratio: 2 (calc) (ref 1.0–2.5)
ALT: 15 U/L (ref 6–29)
AST: 16 U/L (ref 10–35)
Albumin: 4.5 g/dL (ref 3.6–5.1)
Alkaline phosphatase (APISO): 81 U/L (ref 37–153)
BUN: 13 mg/dL (ref 7–25)
CO2: 28 mmol/L (ref 20–32)
Calcium: 9.5 mg/dL (ref 8.6–10.4)
Chloride: 104 mmol/L (ref 98–110)
Creat: 0.59 mg/dL (ref 0.50–1.05)
Globulin: 2.3 g/dL (calc) (ref 1.9–3.7)
Glucose, Bld: 92 mg/dL (ref 65–99)
Potassium: 4.5 mmol/L (ref 3.5–5.3)
Sodium: 141 mmol/L (ref 135–146)
Total Bilirubin: 0.8 mg/dL (ref 0.2–1.2)
Total Protein: 6.8 g/dL (ref 6.1–8.1)
eGFR: 102 mL/min/{1.73_m2} (ref >=60)

## 2021-06-06 LAB — HEMOGLOBIN A1C
Hgb A1c MFr Bld: 5.3 % of total Hgb (ref <5.7)
Mean Plasma Glucose: 105 mg/dL
eAG (mmol/L): 5.8 mmol/L

## 2021-06-06 LAB — TSH: TSH: 1.31 mIU/L (ref 0.40–4.50)

## 2021-06-07 NOTE — Progress Notes (Signed)
Iron and iron stores look good.

## 2021-06-28 LAB — COLOGUARD: COLOGUARD: NEGATIVE

## 2021-06-29 NOTE — Progress Notes (Signed)
Cologuard testing is negative, GREAT news. Repeat in 3 years.

## 2021-10-31 ENCOUNTER — Telehealth: Payer: Self-pay | Admitting: Neurology

## 2021-10-31 NOTE — Telephone Encounter (Signed)
Patient's husband left vm stating patient has an area on her face she is concerned about and would like to make an office visit for evaluation. States he could not get through to anyone on the scheduling line. Please call patient's husband Leah Foley to schedule. [phone]. ?

## 2021-11-01 NOTE — Telephone Encounter (Signed)
LVM on Leah Foley's voicemail & wife's voicemail to call back for an appointment. AM ?

## 2021-11-10 ENCOUNTER — Encounter: Payer: Self-pay | Admitting: Physician Assistant

## 2021-11-10 ENCOUNTER — Ambulatory Visit: Payer: Commercial Managed Care - PPO | Admitting: Physician Assistant

## 2021-11-10 VITALS — BP 144/84 | HR 90 | Ht 66.0 in | Wt 183.0 lb

## 2021-11-10 DIAGNOSIS — D2230 Melanocytic nevi of unspecified part of face: Secondary | ICD-10-CM

## 2021-11-10 NOTE — Progress Notes (Signed)
? ?  Subjective:  ? ? Patient ID: Leah Foley, female    DOB: 11-May-1961, 60 y.o.   MRN: [id number] ? ?HPI ?Leah Foley is a 61 yo female presenting to the clinic today for a skin problem.  ? ?Patient is unsure of when the skin lesion appeared but has noticed it to be irritating lately. She states the edge of her glasses rubs up against it and causes irritation. Lesion is present on her right cheek and she has not noticed anything similar to this in the past. 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  ? ?Review of Systems  ?Skin:  Negative for color change, pallor, rash and wound.  ? ?   ?Objective:  ? Physical Exam ?Skin: ?   General: Skin is warm and dry.  ?   Comments: Smooth, raised, non-pigmented nevus approx 0.5 x 0.3 cm in length on the right upper cheek. Circular and regular border. Slight erythema and tenderness to palpation.  ?Cryotherapy Procedure Note ? ?Pre-operative Diagnosis: irritated nevus ? ?Post-operative Diagnosis: same ? ?Locations: right upper cheek ? ?Indications: irritation ? ?Procedure Details  ?History of allergy to iodine: no. ?Pacemaker? no. ? ?Patient informed of risks (permanent scarring, infection, light or dark discoloration, bleeding, infection, weakness, numbness and recurrence of the lesion) and benefits of the procedure and verbal informed consent obtained. ? ?The areas are treated with liquid nitrogen therapy, frozen until ice ball extended 3 mm beyond lesion, allowed to thaw, and treated again. The patient tolerated procedure well.  The patient was instructed on post-op care, warned that there may be blister formation, redness and pain. Recommend OTC analgesia as needed for pain. ? ?Condition: ?Stable ? ?Complications: ?none. ? ?Plan: ?1. Instructed to keep the area dry and covered for 24-48h and clean thereafter. ?2. Warning signs of infection were reviewed.   ?3. Recommended that the patient use OTC acetaminophen as needed for pain.  ?4.  Return in 2 weeks. ? ?   ?Assessment & Plan:  ?..Leah Foley was seen today for skin problem. ? ?Diagnoses and all orders for this visit: ? ?Irritated nevus of face ? ?Suspect lesion is benign as it shows no concerning signs for malignancy (hyperpigmentation, irregular borders, telangiectasias, blistering). Lesion is irritated and causing distress to patient. Cryotherapy was applied twice for approximately 5 seconds each. Patient was educated on keeping area covered until lesion heals. See back in clinic if lesion worsens or does not get better in 7 days.  ? ?

## 2022-05-22 ENCOUNTER — Ambulatory Visit: Payer: Commercial Managed Care - PPO | Admitting: Physician Assistant

## 2022-05-22 VITALS — BP 125/74 | HR 80 | Ht 66.0 in | Wt 177.0 lb

## 2022-05-22 DIAGNOSIS — Z131 Encounter for screening for diabetes mellitus: Secondary | ICD-10-CM

## 2022-05-22 DIAGNOSIS — Z1329 Encounter for screening for other suspected endocrine disorder: Secondary | ICD-10-CM | POA: Diagnosis not present

## 2022-05-22 DIAGNOSIS — Z13220 Encounter for screening for lipoid disorders: Secondary | ICD-10-CM

## 2022-05-22 DIAGNOSIS — Z0000 Encounter for general adult medical examination without abnormal findings: Secondary | ICD-10-CM

## 2022-05-22 DIAGNOSIS — Z780 Asymptomatic menopausal state: Secondary | ICD-10-CM

## 2022-05-22 NOTE — Patient Instructions (Signed)
Health Maintenance After Age 63 After age 63, you are at a higher risk for certain long-term diseases and infections as well as injuries from falls. Falls are a major cause of broken bones and head injuries in people who are older than age 63. Getting regular preventive care can help to keep you healthy and well. Preventive care includes getting regular testing and making lifestyle changes as recommended by your health care provider. Talk with your health care provider about: Which screenings and tests you should have. A screening is a test that checks for a disease when you have no symptoms. A diet and exercise plan that is right for you. What should I know about screenings and tests to prevent falls? Screening and testing are the best ways to find a health problem early. Early diagnosis and treatment give you the best chance of managing medical conditions that are common after age 63. Certain conditions and lifestyle choices may make you more likely to have a fall. Your health care provider may recommend: Regular vision checks. Poor vision and conditions such as cataracts can make you more likely to have a fall. If you wear glasses, make sure to get your prescription updated if your vision changes. Medicine review. Work with your health care provider to regularly review all of the medicines you are taking, including over-the-counter medicines. Ask your health care provider about any side effects that may make you more likely to have a fall. Tell your health care provider if any medicines that you take make you feel dizzy or sleepy. Strength and balance checks. Your health care provider may recommend certain tests to check your strength and balance while standing, walking, or changing positions. Foot health exam. Foot pain and numbness, as well as not wearing proper footwear, can make you more likely to have a fall. Screenings, including: Osteoporosis screening. Osteoporosis is a condition that causes  the bones to get weaker and break more easily. Blood pressure screening. Blood pressure changes and medicines to control blood pressure can make you feel dizzy. Depression screening. You may be more likely to have a fall if you have a fear of falling, feel depressed, or feel unable to do activities that you used to do. Alcohol use screening. Using too much alcohol can affect your balance and may make you more likely to have a fall. Follow these instructions at home: Lifestyle Do not drink alcohol if: Your health care provider tells you not to drink. If you drink alcohol: Limit how much you have to: 0-1 drink a day for women. 0-2 drinks a day for men. Know how much alcohol is in your drink. In the [place] one drink equals one 12 oz bottle of beer (355 mL), one 5 oz glass of wine (148 mL), or one 1 oz glass of hard liquor (44 mL). Do not use any products that contain nicotine or tobacco. These products include cigarettes, chewing tobacco, and vaping devices, such as e-cigarettes. If you need help quitting, ask your health care provider. Activity  Follow a regular exercise program to stay fit. This will help you maintain your balance. Ask your health care provider what types of exercise are appropriate for you. If you need a cane or walker, use it as recommended by your health care provider. Wear supportive shoes that have nonskid soles. Safety  Remove any tripping hazards, such as rugs, cords, and clutter. Install safety equipment such as grab bars in bathrooms and safety rails on stairs. Keep rooms and walkways   well-lit. General instructions Talk with your health care provider about your risks for falling. Tell your health care provider if: You fall. Be sure to tell your health care provider about all falls, even ones that seem minor. You feel dizzy, tiredness (fatigue), or off-balance. Take over-the-counter and prescription medicines only as told by your health care provider. These include  supplements. Eat a healthy diet and maintain a healthy weight. A healthy diet includes low-fat dairy products, low-fat (lean) meats, and fiber from whole grains, beans, and lots of fruits and vegetables. Stay current with your vaccines. Schedule regular health, dental, and eye exams. Summary Having a healthy lifestyle and getting preventive care can help to protect your health and wellness after age 63. Screening and testing are the best way to find a health problem early and help you avoid having a fall. Early diagnosis and treatment give you the best chance for managing medical conditions that are more common for people who are older than age 63. Falls are a major cause of broken bones and head injuries in people who are older than age 63. Take precautions to prevent a fall at home. Work with your health care provider to learn what changes you can make to improve your health and wellness and to prevent falls. This information is not intended to replace advice given to you by your health care provider. Make sure you discuss any questions you have with your health care provider. Document Revised: 12/13/2020 Document Reviewed: 12/13/2020 Elsevier Patient Education  2023 [employer] Inc.  

## 2022-05-23 ENCOUNTER — Encounter: Payer: Self-pay | Admitting: Physician Assistant

## 2022-05-23 DIAGNOSIS — E7800 Pure hypercholesterolemia, unspecified: Secondary | ICD-10-CM | POA: Insufficient documentation

## 2022-05-23 LAB — LIPID PANEL W/REFLEX DIRECT LDL
Cholesterol: 244 mg/dL — ABNORMAL HIGH (ref <200)
HDL: 77 mg/dL (ref >=50)
LDL Cholesterol (Calc): 140 mg/dL (calc) — ABNORMAL HIGH
Non-HDL Cholesterol (Calc): 167 mg/dL (calc) — ABNORMAL HIGH (ref <130)
Total CHOL/HDL Ratio: 3.2 (calc) (ref <5.0)
Triglycerides: 142 mg/dL (ref <150)

## 2022-05-23 LAB — CBC WITH DIFFERENTIAL/PLATELET
Absolute Monocytes: 374 cells/uL (ref 200–950)
Basophils Absolute: 52 cells/uL (ref 0–200)
Basophils Relative: 1 %
Eosinophils Absolute: 99 cells/uL (ref 15–500)
Eosinophils Relative: 1.9 %
HCT: 44.5 % (ref 35.0–45.0)
Hemoglobin: 15 g/dL (ref 11.7–15.5)
Lymphs Abs: 1836 cells/uL (ref 850–3900)
MCH: 29.8 pg (ref 27.0–33.0)
MCHC: 33.7 g/dL (ref 32.0–36.0)
MCV: 88.3 fL (ref 80.0–100.0)
MPV: 9.8 fL (ref 7.5–12.5)
Monocytes Relative: 7.2 %
Neutro Abs: 2839 cells/uL (ref 1500–7800)
Neutrophils Relative %: 54.6 %
Platelets: 271 10*3/uL (ref 140–400)
RBC: 5.04 10*6/uL (ref 3.80–5.10)
RDW: 13 % (ref 11.0–15.0)
Total Lymphocyte: 35.3 %
WBC: 5.2 10*3/uL (ref 3.8–10.8)

## 2022-05-23 LAB — COMPLETE METABOLIC PANEL WITH GFR
AG Ratio: 1.8 (calc) (ref 1.0–2.5)
ALT: 15 U/L (ref 6–29)
AST: 16 U/L (ref 10–35)
Albumin: 4.6 g/dL (ref 3.6–5.1)
Alkaline phosphatase (APISO): 78 U/L (ref 37–153)
BUN: 12 mg/dL (ref 7–25)
CO2: 29 mmol/L (ref 20–32)
Calcium: 9.4 mg/dL (ref 8.6–10.4)
Chloride: 101 mmol/L (ref 98–110)
Creat: 0.65 mg/dL (ref 0.50–1.05)
Globulin: 2.5 g/dL (calc) (ref 1.9–3.7)
Glucose, Bld: 88 mg/dL (ref 65–99)
Potassium: 4.3 mmol/L (ref 3.5–5.3)
Sodium: 138 mmol/L (ref 135–146)
Total Bilirubin: 1.1 mg/dL (ref 0.2–1.2)
Total Protein: 7.1 g/dL (ref 6.1–8.1)
eGFR: 99 mL/min/{1.73_m2} (ref >=60)

## 2022-05-23 LAB — HEMOGLOBIN A1C
Hgb A1c MFr Bld: 5.6 % of total Hgb (ref <5.7)
Mean Plasma Glucose: 114 mg/dL
eAG (mmol/L): 6.3 mmol/L

## 2022-05-23 LAB — VITAMIN D 25 HYDROXY (VIT D DEFICIENCY, FRACTURES): Vit D, 25-Hydroxy: 17 ng/mL — ABNORMAL LOW (ref 30–100)

## 2022-05-23 LAB — TSH: TSH: 1.11 mIU/L (ref 0.40–4.50)

## 2022-05-23 NOTE — Progress Notes (Signed)
Complete physical exam  Patient: Leah Foley   DOB: 11/21/1960   61 y.o. Female  MRN: [id number]  Subjective:    Chief Complaint  Patient presents with   Annual Exam    Leah Foley is a 61 y.o. female who presents today for a complete physical exam. She reports consuming a general diet. The patient does not participate in regular exercise at present. She generally feels well. She reports sleeping well. She does not have additional problems to discuss today.    Most recent fall risk assessment:    11/10/2021   11:00 AM  Fall Risk   Falls in the past year? 0  Number falls in past yr: 0  Injury with Fall? 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   Double vision 12/13/2017   No energy 12/13/2017   Acute non-recurrent ethmoidal sinusitis 07/21/2016   Single skin nodule 06/06/2016   Left knee pain 11/08/2015   Difficulty sleeping 09/22/2013   Need for prophylactic vaccination with combined diphtheria-tetanus-pertussis (DTP) vaccine 05/03/2013   Other and unspecified hyperlipidemia 05/03/2013   Allergic rhinitis 03/18/2013   Routine general medical examination at a health care facility 03/18/2013   Lipoma 03/18/2013   Past Medical History:  Diagnosis Date   Allergy    Double vision 12/13/2017   Family History  Problem Relation Age of Onset   Lupus Mother    Cancer Father    Kidney disease Maternal Grandfather     Allergies  Allergen Reactions   Azithromycin Palpitations   Sulfa Antibiotics Rash      Patient Care Team: Lavada Mesi as PCP - General (Family Medicine)   Outpatient Medications Prior to Visit  Medication Sig   ibuprofen (ADVIL,MOTRIN) 200 MG tablet Take 200 mg by mouth every 6 (six) hours as needed.   Multiple Vitamin (MULTIVITAMIN) tablet Take 1 tablet by mouth daily.   No facility-administered medications prior to visit.    Review of Systems  All other systems reviewed and are negative.         Objective:     BP 125/74   Pulse 80   Ht '5\' 6"'$  (1.676 m)   Wt 177 lb (80.3 kg)   SpO2 100%   BMI 28.57 kg/m  BP Readings from Last 3 Encounters:  05/22/22 125/74  11/10/21 (!) 144/84  06/02/21 132/70   Wt Readings from Last 3 Encounters:  05/22/22 177 lb (80.3 kg)  11/10/21 183 lb (83 kg)  06/02/21 180 lb (81.6 kg)    ..    05/22/2022    4:06 PM 11/10/2021   11:00 AM 06/02/2021    9:08 AM 06/21/2018    1:54 PM 09/28/2017    1:07 PM  Depression screen PHQ 2/9  Decreased Interest 0 0 0 0 0  Down, Depressed, Hopeless 0 0  0 0 0  PHQ - 2 Score 0 0 0 0 0  Altered sleeping 0      Tired, decreased energy 1      Change in appetite 0      Feeling bad or failure about yourself  0      Trouble concentrating 0      Moving slowly or fidgety/restless 0      Suicidal thoughts 0      PHQ-9 Score 1      Difficult doing work/chores Not difficult at all       ..    05/22/2022    4:07 PM  GAD 7 : Generalized Anxiety Score  Nervous, Anxious, on Edge 0  Control/stop worrying 0  Worry too much - different things 0  Trouble relaxing 0  Restless 0  Easily annoyed or irritable 0  Afraid - awful might happen 0  Total GAD 7 Score 0  Anxiety Difficulty Not difficult at all      Physical Exam   BP 125/74   Pulse 80   Ht '5\' 6"'$  (1.676 m)   Wt 177 lb (80.3 kg)   SpO2 100%   BMI 28.57 kg/m   General Appearance:    Alert, cooperative, no distress,  appears stated age  Head:    Normocephalic, without obvious abnormality, atraumatic  Eyes:    PERRL, conjunctiva/corneas clear, EOM's intact, fundi    benign, both eyes  Ears:    Normal TM's and external ear canals, both ears  Nose:   Nares normal, septum midline, mucosa normal, no drainage    or sinus tenderness  Throat:   Lips, mucosa, and tongue normal; teeth and gums normal  Neck:   Supple, symmetrical, trachea midline, no adenopathy;    thyroid:  no enlargement/tenderness/nodules; no carotid   bruit or JVD  Back:     Symmetric, no curvature, ROM normal, no CVA tenderness  Lungs:     Clear to auscultation bilaterally, respirations unlabored  Chest Wall:    No tenderness or deformity   Heart:    Regular rate and rhythm, S1 and S2 normal, no murmur, rub   or gallop     Abdomen:     Soft, non-tender, bowel sounds active all four quadrants,    no masses, no organomegaly        Extremities:   Extremities normal, atraumatic, no cyanosis or edema  Pulses:   2+ and symmetric all extremities  Skin:   Skin color, texture, turgor normal, no rashes or lesions  Lymph nodes:   Cervical, supraclavicular, and axillary nodes normal  Neurologic:   CNII-XII intact, normal strength, sensation and reflexes    throughout      Assessment & Plan:    Routine Health Maintenance and Physical Exam  Immunization History  Administered Date(s) Administered   Influenza,inj,Quad PF,6+ Mos 06/02/2021   Tdap 03/18/2013    Health Maintenance  Topic Date Due   MAMMOGRAM  05/07/2014   Zoster Vaccines- Shingrix (1 of 2) 08/22/2022 (Originally 01/09/1978)   INFLUENZA VACCINE  11/05/2022 (Originally 03/07/2022)   PAP SMEAR-Modifier  09/23/2023 (Originally 01/10/1980)   HIV Screening  06/05/2026 (Originally 01/09/1974)   TETANUS/TDAP  03/19/2023   Fecal DNA (Cologuard)  06/22/2024   Hepatitis C Screening  Completed   HPV VACCINES  Aged Out   COLONOSCOPY (Pts 45-56yr Insurance coverage will need to be confirmed)   Discontinued    Discussed health benefits of physical activity, and encouraged her to engage  in regular exercise appropriate for her age and condition.  Marland KitchenSantiago Foley was seen today for annual exam.  Diagnoses and all orders for this visit:  Routine general medical examination at a health care facility -     TSH -     Lipid Panel w/reflex Direct LDL -     COMPLETE METABOLIC PANEL WITH GFR -     CBC with Differential/Platelet -     Hemoglobin A1c -     VITAMIN D 25 Hydroxy (Vit-D Deficiency, Fractures)  Lipid screening -     Lipid Panel w/reflex Direct LDL  Diabetes mellitus screening -     COMPLETE METABOLIC PANEL WITH GFR  Thyroid disorder screen -     TSH  Post-menopausal -     VITAMIN D 25 Hydroxy (Vit-D Deficiency, Fractures)   .Marland Kitchen Discussed 150 minutes of exercise a week.  Encouraged vitamin D 1000 units and Calcium '1300mg'$  or 4 servings of dairy a day.  Fasting labs ordered PHQ no concerns Discussed need for mammogram and has been ordered she just needs to schedule Cologuard UTD Bone density due in 2 years Declined flu/covid/shingles vaccine Return in about 1 year (around 05/23/2023).     Iran Planas, PA-C

## 2022-05-23 NOTE — Progress Notes (Signed)
Leah Foley,   Thyroid looks good.  A1C normal range.  WBC and hemoglobin normal.  Kidney, liver, glucose look good.  Vitamin D is low. You need to be taking 2000 units daily with diary or protein.  Cholesterol is stable but LDL still elevated.  HDL looks great.   Your 10 year risk is under 7.5 percent at this time.   Marland Kitchen.The 10-year ASCVD risk score (Arnett DK, et al., 2019) is: 4.1%   Values used to calculate the score:     Age: 61 years     Sex: Female     Is Non-Hispanic African American: No     Diabetic: No     Tobacco smoker: No     Systolic Blood Pressure: 110 mmHg     Is BP treated: No     HDL Cholesterol: 77 mg/dL     Total Cholesterol: 244 mg/dL

## 2022-09-25 ENCOUNTER — Ambulatory Visit: Payer: Commercial Managed Care - PPO | Admitting: Family Medicine

## 2022-09-27 ENCOUNTER — Encounter: Payer: Self-pay | Admitting: Physician Assistant

## 2022-09-27 ENCOUNTER — Ambulatory Visit (INDEPENDENT_AMBULATORY_CARE_PROVIDER_SITE_OTHER): Payer: Commercial Managed Care - PPO | Admitting: Physician Assistant

## 2022-09-27 VITALS — BP 138/84 | HR 87 | Temp 98.0°F | Ht 66.0 in | Wt 173.0 lb

## 2022-09-27 DIAGNOSIS — R52 Pain, unspecified: Secondary | ICD-10-CM

## 2022-09-27 DIAGNOSIS — J0140 Acute pansinusitis, unspecified: Secondary | ICD-10-CM | POA: Diagnosis not present

## 2022-09-27 LAB — POC COVID19 BINAXNOW: SARS Coronavirus 2 Ag: NEGATIVE

## 2022-09-27 NOTE — Patient Instructions (Signed)

## 2022-09-27 NOTE — Progress Notes (Unsigned)
Acute Office Visit  Subjective:     Patient ID: Leah Foley, female    DOB: Apr 06, 1961, 62 y.o.   MRN: [id number]  Chief Complaint  Patient presents with   Generalized Body Aches    HPI Patient is in today for body aches, fatigue after root canal last Thursday, 6 days ago. Leah Foley is on amoxicillin. Leah Foley has had a house full of sickness. No fever, chills, or cough. Leah Foley is concerned about covid. All Leah Foley joints and muscles are achy. Ibuprofen and tylenol do help some. No SOB.   Marland Kitchen. 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     Objective:    BP 138/84   Pulse 87   Temp 98 F (36.7 C) (Oral)   Ht 5' 6"$  (1.676 m)   Wt 173 lb (78.5 kg)   SpO2 99%   BMI 27.92 kg/m  BP Readings from Last 3 Encounters:  09/27/22 138/84  05/22/22 125/74  11/10/21 (!) 144/84   Wt Readings from Last 3 Encounters:  09/27/22 173 lb (78.5 kg)  05/22/22 177 lb (80.3 kg)  11/10/21 183 lb (83 kg)      Physical Exam Constitutional:      Appearance: Normal appearance.  HENT:     Head:  Normocephalic.     Comments: No tenderness to palpation over sinuses.     Right Ear: Tympanic membrane, ear canal and external ear normal. There is no impacted cerumen.     Left Ear: Tympanic membrane, ear canal and external ear normal. There is no impacted cerumen.     Nose: Nose normal. No congestion or rhinorrhea.     Mouth/Throat:     Mouth: Mucous membranes are moist.     Pharynx: No oropharyngeal exudate or posterior oropharyngeal erythema.  Eyes:     Conjunctiva/sclera: Conjunctivae normal.  Neck:     Vascular: No carotid bruit.  Cardiovascular:     Rate and Rhythm: Normal rate and regular rhythm.  Pulmonary:     Effort: Pulmonary effort is normal.     Breath sounds: Normal breath sounds.  Lymphadenopathy:     Cervical: No cervical adenopathy.  Neurological:     General: No focal deficit present.     Mental Status: Leah Foley is alert and oriented to person, place, and time.  Psychiatric:        Mood and Affect: Mood normal.     Results for orders placed or performed in visit on 09/27/22  POC COVID-19  Result Value Ref Range   SARS Coronavirus 2 Ag Negative Negative  Assessment & Plan:  Marland KitchenMarland KitchenShanet was seen today for generalized body aches.  Diagnoses and all orders for this visit:  Body aches -     POC COVID-19 -     CBC w/Diff/Platelet -     COMPLETE METABOLIC PANEL WITH GFR  Acute non-recurrent pansinusitis   Negative for covid No signs of bacterial infection On amoxicillin, not uncommon for infection/aches after root canal but certainly lingering a little longer than expected Leah Foley could have also gotten viral infection from sick exposures Will check cbc and cmp today Rest, hydrate, zinc, vitamin C and complete amoxil Follow up as needed or if symptoms worsening or persisting    No follow-ups on file.  Iran Planas, PA-C

## 2022-09-28 ENCOUNTER — Encounter: Payer: Self-pay | Admitting: Physician Assistant

## 2022-09-28 LAB — CBC WITH DIFFERENTIAL/PLATELET
Absolute Monocytes: 464 cells/uL (ref 200–950)
Basophils Absolute: 41 cells/uL (ref 0–200)
Basophils Relative: 0.8 %
Eosinophils Absolute: 41 cells/uL (ref 15–500)
Eosinophils Relative: 0.8 %
HCT: 45.4 % — ABNORMAL HIGH (ref 35.0–45.0)
Hemoglobin: 15.2 g/dL (ref 11.7–15.5)
Lymphs Abs: 1775 cells/uL (ref 850–3900)
MCH: 29.3 pg (ref 27.0–33.0)
MCHC: 33.5 g/dL (ref 32.0–36.0)
MCV: 87.5 fL (ref 80.0–100.0)
MPV: 9.6 fL (ref 7.5–12.5)
Monocytes Relative: 9.1 %
Neutro Abs: 2780 cells/uL (ref 1500–7800)
Neutrophils Relative %: 54.5 %
Platelets: 297 10*3/uL (ref 140–400)
RBC: 5.19 10*6/uL — ABNORMAL HIGH (ref 3.80–5.10)
RDW: 12.6 % (ref 11.0–15.0)
Total Lymphocyte: 34.8 %
WBC: 5.1 10*3/uL (ref 3.8–10.8)

## 2022-09-28 LAB — COMPLETE METABOLIC PANEL WITH GFR
AG Ratio: 1.7 (calc) (ref 1.0–2.5)
ALT: 12 U/L (ref 6–29)
AST: 13 U/L (ref 10–35)
Albumin: 4.6 g/dL (ref 3.6–5.1)
Alkaline phosphatase (APISO): 68 U/L (ref 37–153)
BUN: 9 mg/dL (ref 7–25)
CO2: 29 mmol/L (ref 20–32)
Calcium: 9.5 mg/dL (ref 8.6–10.4)
Chloride: 103 mmol/L (ref 98–110)
Creat: 0.63 mg/dL (ref 0.50–1.05)
Globulin: 2.7 g/dL (calc) (ref 1.9–3.7)
Glucose, Bld: 96 mg/dL (ref 65–99)
Potassium: 4 mmol/L (ref 3.5–5.3)
Sodium: 140 mmol/L (ref 135–146)
Total Bilirubin: 1 mg/dL (ref 0.2–1.2)
Total Protein: 7.3 g/dL (ref 6.1–8.1)
eGFR: 100 mL/min/{1.73_m2} (ref >=60)

## 2022-09-28 NOTE — Progress Notes (Signed)
WBC good. No sign of bacterial infection.  Kidney, liver, glucose look great.  Hematocrit a little elevated with RBC and hemoglobin.  Don't take any extra iron.

## 2023-02-09 ENCOUNTER — Ambulatory Visit
Admission: RE | Admit: 2023-02-09 | Discharge: 2023-02-09 | Disposition: A | Discharge status: Home / Self Care | Payer: Commercial Managed Care - PPO | Source: Ambulatory Visit | Attending: Urgent Care | Admitting: Urgent Care

## 2023-02-09 VITALS — BP 115/78 | HR 80 | Temp 97.3°F | Resp 16

## 2023-02-09 DIAGNOSIS — L280 Lichen simplex chronicus: Secondary | ICD-10-CM

## 2023-02-09 MED ORDER — CLOBETASOL PROPIONATE 0.05 % EX OINT: 1.0000 | TOPICAL_OINTMENT | Freq: Two times a day (BID) | CUTANEOUS | 1 refills | Status: AC

## 2023-02-09 MED ORDER — PREDNISONE 10 MG (21) PO TBPK: ORAL_TABLET | Freq: Every day | ORAL | 0 refills | Status: DC

## 2023-02-09 NOTE — ED Provider Notes (Signed)
[facility]    CSN: [id number] Arrival date & time: 02/09/23  1752      History   Chief Complaint Chief Complaint  Patient presents with   Rash    On legs very severe - Entered by patient    HPI Leah Foley is a 62 y.o. female.   Pleasant 62 year old female presents today due to concerns of rash to her bilateral lower extremities.  Leah Foley states has been present for at least the last 2 months.  Leah Foley reports that during COVID Leah Foley made a lots of handmade soap, but feels that it may have lost its integrity with time.  Believes that her rash started initially with use of her homemade soap.  Leah Foley also reports gardening and getting very dirty.  Leah Foley takes lots of baths.  States that the rash becomes more itchy after the bath.  Leah Foley has used numerous topical agents, all natural or homeopathic.  States the rash initially started on her ankles, but now affects her entire lower extremity bilaterally.  Denies any crusty lesions or weeping.  No one else in the family has similar symptoms.  Leah Foley denies a rash anywhere else on her body. Itching has started keeping her awake at night.   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   Double vision 12/13/2017   No energy 12/13/2017   Acute non-recurrent ethmoidal sinusitis 07/21/2016   Single skin nodule 06/06/2016   Left knee pain 11/08/2015   Difficulty sleeping 09/22/2013   Need for prophylactic vaccination with combined diphtheria-tetanus-pertussis (DTP) vaccine 05/03/2013   Other and unspecified hyperlipidemia 05/03/2013   Allergic rhinitis 03/18/2013   Routine general medical  examination at a health care facility 03/18/2013   Lipoma 03/18/2013    Past Surgical History:  Procedure Laterality Date   ABDOMINAL HYSTERECTOMY     APPENDECTOMY      OB History   No obstetric history on file.      Home Medications    Prior to Admission medications   Medication Sig Start Date End Date Taking? Authorizing Provider  clobetasol ointment (TEMOVATE) 0.05 % Apply 1 Application topically 2 (two) times daily. 02/09/23  Yes Seletha Zimmermann L, PA  predniSONE (STERAPRED UNI-PAK 21 TAB) 10 MG (21) TBPK tablet Take by mouth daily. Take 6 tabs by mouth daily  for 2 days, then 5 tabs for 2 days, then 4 tabs for 2 days, then 3 tabs for 2 days, 2 tabs for 2 days, then 1 tab by mouth daily for 2 days 02/09/23  Yes Elnora Quizon L, PA  ibuprofen (ADVIL,MOTRIN) 200 MG tablet Take 200 mg by mouth every 6 (six) hours as needed.    [provider]  Multiple Vitamin (MULTIVITAMIN) tablet Take 1 tablet by mouth daily.    [provider]    Family History Family History  Problem Relation Age of Onset   Lupus Mother    Cancer Father    Kidney disease Maternal Grandfather     Social History Social History   Tobacco Use   Smoking status: Never   Smokeless tobacco: Never  Substance Use  Topics   Alcohol use: No   Drug use: No     Allergies   Azithromycin and Sulfa antibiotics   Review of Systems Review of Systems  Skin:  Positive for rash.  As per HPI   Physical Exam Triage Vital Signs ED Triage Vitals  Enc Vitals Group     BP 02/09/23 1801 115/78     Pulse Rate 02/09/23 1801 80     Resp 02/09/23 1801 16     Temp 02/09/23 1801 (!) 97.3 F (36.3 C)     Temp src --      SpO2 02/09/23 1801 98 %     Weight --      Height --      Head Circumference --      Peak Flow --      Pain Score 02/09/23 1759 3     Pain Loc --      Pain Edu? --      Excl. in GC? --    No data found.  Updated Vital Signs BP 115/78   Pulse 80   Temp (!) 97.3 F (36.3  C)   Resp 16   SpO2 98%   Visual Acuity Right Eye Distance:   Left Eye Distance:   Bilateral Distance:    Right Eye Near:   Left Eye Near:    Bilateral Near:     Physical Exam Vitals and nursing note reviewed.  Constitutional:      General: Leah Foley is not in acute distress.    Appearance: Normal appearance. Leah Foley is normal weight. Leah Foley is not ill-appearing, toxic-appearing or diaphoretic.  HENT:     Head: Normocephalic and atraumatic.  Eyes:     General: No scleral icterus.       Right eye: No discharge.        Left eye: No discharge.     Pupils: Pupils are equal, round, and reactive to light.  Cardiovascular:     Rate and Rhythm: Normal rate.  Pulmonary:     Effort: Pulmonary effort is normal. No respiratory distress.  Musculoskeletal:        General: No swelling or tenderness. Normal range of motion.     Right lower leg: No edema.     Left lower leg: No edema.  Skin:    General: Skin is warm and dry.     Findings: Erythema and rash present.     Comments: Large dry areas of scaly, lichenified erythematous patches.  Neurological:     General: No focal deficit present.     Mental Status: Leah Foley is alert and oriented to person, place, and time.     Sensory: No sensory deficit.     Motor: No weakness.      UC Treatments / Results  Labs (all labs ordered are listed, but only abnormal results are displayed) Labs Reviewed - No data to display  EKG   Radiology No results found.  Procedures Procedures (including critical care time)  Medications Ordered in UC Medications - No data to display  Initial Impression / Assessment and Plan / UC Course  I have reviewed the triage vital signs and the nursing notes.  Pertinent labs & imaging results that were available during my care of the patient were reviewed by me and considered in my medical decision making (see chart for details).     Lichen simplex chronicus - will start with topical steroid ointment.  Will also do  p.o. steroid pack.  Patient understands  to cut the temperature of her bath water, and to bathe less frequently.  Over-the-counter Aquaphor can also be used.  Patient to follow-up with PCP should her symptoms persist   Final Clinical Impressions(s) / UC Diagnoses   Final diagnoses:  Lichen simplex chronicus     Discharge Instructions      Your condition resembles lichen simplex chronicus, which is an itchy dry rash.  Please use the topical clobetasol ointment to the affected areas of your lower legs twice daily for up to 2 weeks. Do not exceed 14 days of consecutive use. Please also take the oral steroid taper pack, per package directions. Please use lukewarm water in your bath, and blot dry.  After each bath use topical Aquaphor. Try to avoid bathing more than 2-3 times weekly. Should symptoms persist or worsen, please return for recheck.    ED Prescriptions     Medication Sig Dispense Auth. Provider   clobetasol ointment (TEMOVATE) 0.05 % Apply 1 Application topically 2 (two) times daily. 60 g Irvin Bastin L, PA   predniSONE (STERAPRED UNI-PAK 21 TAB) 10 MG (21) TBPK tablet Take by mouth daily. Take 6 tabs by mouth daily  for 2 days, then 5 tabs for 2 days, then 4 tabs for 2 days, then 3 tabs for 2 days, 2 tabs for 2 days, then 1 tab by mouth daily for 2 days 42 tablet Katie Faraone L, PA      PDMP not reviewed this encounter.   Maretta Bees, [place] 02/09/23 [phone]

## 2023-02-09 NOTE — Discharge Instructions (Addendum)
Your condition resembles lichen simplex chronicus, which is an itchy dry rash.  Please use the topical clobetasol ointment to the affected areas of your lower legs twice daily for up to 2 weeks. Do not exceed 14 days of consecutive use. Please also take the oral steroid taper pack, per package directions. Please use lukewarm water in your bath, and blot dry.  After each bath use topical Aquaphor. Try to avoid bathing more than 2-3 times weekly. Should symptoms persist or worsen, please return for recheck.

## 2023-02-09 NOTE — ED Triage Notes (Addendum)
Pt presents to uc with co of rash on bilateral legs ffor 2 months. Pt reports it is very itchy and is spreading. Pt reports she takes baths and is unsure if this is harming it.   Pt reports itchiness is worse at night and after a bath  Pt has been using coconut oil on it, olive oil. Pt was concerned it was excema. Pt reports no one in the home has gotten it.

## 2023-05-25 ENCOUNTER — Ambulatory Visit (INDEPENDENT_AMBULATORY_CARE_PROVIDER_SITE_OTHER): Payer: Commercial Managed Care - PPO | Admitting: Physician Assistant

## 2023-05-25 ENCOUNTER — Encounter: Payer: Self-pay | Admitting: Physician Assistant

## 2023-05-25 VITALS — BP 129/85 | HR 72 | Ht 66.0 in | Wt 166.0 lb

## 2023-05-25 DIAGNOSIS — Z0000 Encounter for general adult medical examination without abnormal findings: Secondary | ICD-10-CM | POA: Diagnosis not present

## 2023-05-25 DIAGNOSIS — Z131 Encounter for screening for diabetes mellitus: Secondary | ICD-10-CM | POA: Diagnosis not present

## 2023-05-25 DIAGNOSIS — E7800 Pure hypercholesterolemia, unspecified: Secondary | ICD-10-CM

## 2023-05-25 DIAGNOSIS — Z780 Asymptomatic menopausal state: Secondary | ICD-10-CM | POA: Diagnosis not present

## 2023-05-25 NOTE — Progress Notes (Unsigned)
Complete physical exam  Patient: Leah Foley   DOB: [birth year]   62 y.o. Female  MRN: [id number]  Subjective:    Chief Complaint  Patient presents with   Annual Exam    Fasting labs     Nkiruka Samaroo is a 62 y.o. female who presents today for a complete physical exam. She reports consuming a general diet.  Pt is very active but does not exercise.  She generally feels well. She reports sleeping well. She does not have additional problems to discuss today.    Most recent fall risk assessment:    09/27/2022    3:24 PM  Fall Risk   Falls in the past year? 0  Number falls in past yr: 0  Injury with Fall? 0  Risk for fall due to : No Fall Risks  Follow up Falls evaluation completed     Most recent depression screenings:    05/25/2023    2:24 PM 09/27/2022    3:24 PM  PHQ 2/9 Scores  PHQ - 2 Score 0 0    Vision:Within last year and Dental: No current dental problems and Receives regular dental care  Patient Active Problem List   Diagnosis Date Noted   Post-menopausal 05/25/2023   Elevated LDL cholesterol level 05/23/2022   Irritated nevus of face 11/10/2021   Lateral epicondylitis of right elbow 02/11/2018   Partial tear of common extensor tendon of elbow 02/11/2018   Dry eyes, bilateral 12/19/2017   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   Double vision 12/13/2017   No energy 12/13/2017   Acute non-recurrent ethmoidal sinusitis 07/21/2016   Single skin nodule 06/06/2016   Left knee pain 11/08/2015   Difficulty sleeping 09/22/2013   Need for prophylactic vaccination with combined diphtheria-tetanus-pertussis (DTP) vaccine 05/03/2013   Other and unspecified hyperlipidemia 05/03/2013   Allergic rhinitis 03/18/2013   Routine general medical examination at a health care facility 03/18/2013   Lipoma 03/18/2013   Past Medical History:  Diagnosis Date   Allergy    Double vision 12/13/2017   Past Surgical History:  Procedure Laterality Date   ABDOMINAL HYSTERECTOMY      APPENDECTOMY     Family History  Problem Relation Age of Onset   Lupus Mother    Cancer Father    Kidney disease Maternal Grandfather    Allergies  Allergen Reactions   Azithromycin Palpitations   Sulfa Antibiotics Rash      Patient Care Team: Nolene Ebbs as PCP - General (Family Medicine)   Outpatient Medications Prior to Visit  Medication Sig   clobetasol ointment (TEMOVATE) 0.05 % Apply 1 Application topically 2 (two) times daily.   ibuprofen (ADVIL,MOTRIN) 200 MG tablet Take 200 mg by mouth every 6 (six) hours as needed.   Multiple Vitamin (MULTIVITAMIN) tablet Take 1 tablet by mouth daily.   [DISCONTINUED] predniSONE (STERAPRED UNI-PAK 21 TAB) 10 MG (21) TBPK tablet Take by mouth daily. Take 6 tabs by mouth daily  for 2 days, then 5 tabs for 2 days, then 4 tabs for 2 days, then 3 tabs for 2 days, 2 tabs for 2 days, then 1 tab by mouth daily for 2 days   No facility-administered medications prior to visit.    Review of Systems  All other systems reviewed and are negative.         Objective:     BP 129/85   Pulse 72   Ht 5\' 6"  (1.676 m)   Wt 166 lb (75.3 kg)  BMI 26.79 kg/m  BP Readings from Last 3 Encounters:  05/25/23 129/85  02/09/23 115/78  09/27/22 138/84   Wt Readings from Last 3 Encounters:  05/25/23 166 lb (75.3 kg)  09/27/22 173 lb (78.5 kg)  05/22/22 177 lb (80.3 kg)      Physical Exam  BP 129/85   Pulse 72   Ht 5\' 6"  (1.676 m)   Wt 166 lb (75.3 kg)   BMI 26.79 kg/m   General Appearance:    Alert, cooperative, no distress, appears stated age  Head:    Normocephalic, without obvious abnormality, atraumatic  Eyes:    PERRL, conjunctiva/corneas clear, EOM's intact, fundi    benign, both eyes  Ears:    Normal TM's and external ear canals, both ears  Nose:   Nares normal, septum midline, mucosa normal, no drainage    or sinus tenderness  Throat:   Lips, mucosa, and tongue normal; teeth and gums normal  Neck:   Supple,  symmetrical, trachea midline, no adenopathy;    thyroid:  no enlargement/tenderness/nodules; no carotid   bruit or JVD  Back:     Symmetric, no curvature, ROM normal, no CVA tenderness  Lungs:     Clear to auscultation bilaterally, respirations unlabored  Chest Wall:    No tenderness or deformity   Heart:    Regular rate and rhythm, S1 and S2 normal, no murmur, rub   or gallop     Abdomen:     Soft, non-tender, bowel sounds active all four quadrants,    no masses, no organomegaly        Extremities:   Extremities normal, atraumatic, no cyanosis or edema  Pulses:   2+ and symmetric all extremities  Skin:   Skin color, texture, turgor normal, no rashes or lesions  Lymph nodes:   Cervical, supraclavicular, and axillary nodes normal  Neurologic:   CNII-XII intact, normal strength, sensation and reflexes    throughout      Assessment & Plan:    Routine Health Maintenance and Physical Exam  Immunization History  Administered Date(s) Administered   Influenza,inj,Quad PF,6+ Mos 06/02/2021   Tdap 03/18/2013    Health Maintenance  Topic Date Due   DTaP/Tdap/Td (2 - Td or Tdap) 03/19/2023   COVID-19 Vaccine (1) 06/10/2023 (Originally 01/10/1966)   Zoster Vaccines- Shingrix (1 of 2) 08/25/2023 (Originally 01/09/1978)   INFLUENZA VACCINE  11/05/2023 (Originally 03/08/2023)   MAMMOGRAM  05/24/2024 (Originally 05/07/2014)   HIV Screening  06/05/2026 (Originally [birth year]09[birth year]1974)   Fecal DNA (Cologuard)  06/22/2024   Hepatitis C Screening  Completed   HPV VACCINES  Aged Out   Colonoscopy  Discontinued    Discussed health benefits of physical activity, and encouraged her to engage in regular exercise appropriate for her age and condition.  Marland KitchenClydie Braun was seen today for annual exam.  Diagnoses and all orders for this visit:  Routine general medical examination at a health care facility -     CBC w/Diff/Platelet -     CMP14+EGFR -     Lipid panel -     TSH -     Vitamin D (25 hydroxy) -     HgB  A1c -     MM 3D SCREENING MAMMOGRAM BILATERAL BREAST  Elevated LDL cholesterol level -     Lipid panel  Screening for diabetes mellitus -     CMP14+EGFR -     HgB A1c  Post-menopausal -     Vitamin D (25 hydroxy)   .Marland Kitchen  Discussed 150 minutes of exercise a week.  Encouraged vitamin D 1000 units and Calcium 1300mg  or 4 servings of dairy a day.  Fasting labs ordered PHQ no concerns Mammogram due this month Cologuard UTD due next year No need for pap Next year will get bone density Discussed need for: shingles/Tdap/Flu/covid/RSV     Pt declined all vaccines but wanted to know what was indicated so she could think about it.      Tandy Gaw, PA-C

## 2023-05-25 NOTE — Patient Instructions (Addendum)
Flu-yearly Shingles- 2 dose series Tdap-every 10 years RSV- once after 60 Pneumonia 20 once after 65 Covid booster yearly  Health Maintenance, Female Adopting a healthy lifestyle and getting preventive care are important in promoting health and wellness. Ask your health care provider about: The right schedule for you to have regular tests and exams. Things you can do on your own to prevent diseases and keep yourself healthy. What should I know about diet, weight, and exercise? Eat a healthy diet  Eat a diet that includes plenty of vegetables, fruits, low-fat dairy products, and lean protein. Do not eat a lot of foods that are high in solid fats, added sugars, or sodium. Maintain a healthy weight Body mass index (BMI) is used to identify weight problems. It estimates body fat based on height and weight. Your health care provider can help determine your BMI and help you achieve or maintain a healthy weight. Get regular exercise Get regular exercise. This is one of the most important things you can do for your health. Most adults should: Exercise for at least 150 minutes each week. The exercise should increase your heart rate and make you sweat (moderate-intensity exercise). Do strengthening exercises at least twice a week. This is in addition to the moderate-intensity exercise. Spend less time sitting. Even light physical activity can be beneficial. Watch cholesterol and blood lipids Have your blood tested for lipids and cholesterol at 64 years of age, then have this test every 5 years. Have your cholesterol levels checked more often if: Your lipid or cholesterol levels are high. You are older than 64 years of age. You are at high risk for heart disease. What should I know about cancer screening? Depending on your health history and family history, you may need to have cancer screening at various ages. This may include screening for: Breast cancer. Cervical cancer. Colorectal  cancer. Skin cancer. Lung cancer. What should I know about heart disease, diabetes, and high blood pressure? Blood pressure and heart disease High blood pressure causes heart disease and increases the risk of stroke. This is more likely to develop in people who have high blood pressure readings or are overweight. Have your blood pressure checked: Every 3-5 years if you are 41-63 years of age. Every year if you are 46 years old or older. Diabetes Have regular diabetes screenings. This checks your fasting blood sugar level. Have the screening done: Once every three years after age 70 if you are at a normal weight and have a low risk for diabetes. More often and at a younger age if you are overweight or have a high risk for diabetes. What should I know about preventing infection? Hepatitis B If you have a higher risk for hepatitis B, you should be screened for this virus. Talk with your health care provider to find out if you are at risk for hepatitis B infection. Hepatitis C Testing is recommended for: Everyone born from [birth year] through 1965. Anyone with known risk factors for hepatitis C. Sexually transmitted infections (STIs) Get screened for STIs, including gonorrhea and chlamydia, if: You are sexually active and are younger than 64 years of age. You are older than 64 years of age and your health care provider tells you that you are at risk for this type of infection. Your sexual activity has changed since you were last screened, and you are at increased risk for chlamydia or gonorrhea. Ask your health care provider if you are at risk. Ask your health care provider about  whether you are at high risk for HIV. Your health care provider may recommend a prescription medicine to help prevent HIV infection. If you choose to take medicine to prevent HIV, you should first get tested for HIV. You should then be tested every 3 months for as long as you are taking the medicine. Pregnancy If you are  about to stop having your period (premenopausal) and you may become pregnant, seek counseling before you get pregnant. Take 400 to 800 micrograms (mcg) of folic acid every day if you become pregnant. Ask for birth control (contraception) if you want to prevent pregnancy. Osteoporosis and menopause Osteoporosis is a disease in which the bones lose minerals and strength with aging. This can result in bone fractures. If you are 6 years old or older, or if you are at risk for osteoporosis and fractures, ask your health care provider if you should: Be screened for bone loss. Take a calcium or vitamin D supplement to lower your risk of fractures. Be given hormone replacement therapy (HRT) to treat symptoms of menopause. Follow these instructions at home: Alcohol use Do not drink alcohol if: Your health care provider tells you not to drink. You are pregnant, may be pregnant, or are planning to become pregnant. If you drink alcohol: Limit how much you have to: 0-1 drink a day. Know how much alcohol is in your drink. In the [place] one drink equals one 12 oz bottle of beer (355 mL), one 5 oz glass of wine (148 mL), or one 1 oz glass of hard liquor (44 mL). Lifestyle Do not use any products that contain nicotine or tobacco. These products include cigarettes, chewing tobacco, and vaping devices, such as e-cigarettes. If you need help quitting, ask your health care provider. Do not use street drugs. Do not share needles. Ask your health care provider for help if you need support or information about quitting drugs. General instructions Schedule regular health, dental, and eye exams. Stay current with your vaccines. Tell your health care provider if: You often feel depressed. You have ever been abused or do not feel safe at home. Summary Adopting a healthy lifestyle and getting preventive care are important in promoting health and wellness. Follow your health care provider's instructions about  healthy diet, exercising, and getting tested or screened for diseases. Follow your health care provider's instructions on monitoring your cholesterol and blood pressure. This information is not intended to replace advice given to you by your health care provider. Make sure you discuss any questions you have with your health care provider. Document Revised: 12/13/2020 Document Reviewed: 12/13/2020 Elsevier Patient Education  2024 [employer].

## 2023-05-26 LAB — CMP14+EGFR
ALT: 19 [IU]/L (ref 0–32)
AST: 19 [IU]/L (ref 0–40)
Albumin: 4.7 g/dL (ref 3.9–4.9)
Alkaline Phosphatase: 95 [IU]/L (ref 44–121)
BUN/Creatinine Ratio: 17 (ref 12–28)
BUN: 11 mg/dL (ref 8–27)
Bilirubin Total: 0.7 mg/dL (ref 0.0–1.2)
CO2: 24 mmol/L (ref 20–29)
Calcium: 9.4 mg/dL (ref 8.7–10.3)
Chloride: 100 mmol/L (ref 96–106)
Creatinine, Ser: 0.65 mg/dL (ref 0.57–1.00)
Globulin, Total: 2.4 g/dL (ref 1.5–4.5)
Glucose: 95 mg/dL (ref 70–99)
Potassium: 4.1 mmol/L (ref 3.5–5.2)
Sodium: 138 mmol/L (ref 134–144)
Total Protein: 7.1 g/dL (ref 6.0–8.5)
eGFR: 98 mL/min/{1.73_m2} (ref >59)

## 2023-05-26 LAB — CBC WITH DIFFERENTIAL/PLATELET
Basophils Absolute: 0.1 10*3/uL (ref 0.0–0.2)
Basos: 1 %
EOS (ABSOLUTE): 0.1 10*3/uL (ref 0.0–0.4)
Eos: 1 %
Hematocrit: 47 % — ABNORMAL HIGH (ref 34.0–46.6)
Hemoglobin: 15 g/dL (ref 11.1–15.9)
Immature Grans (Abs): 0 10*3/uL (ref 0.0–0.1)
Immature Granulocytes: 0 %
Lymphocytes Absolute: 1.8 10*3/uL (ref 0.7–3.1)
Lymphs: 36 %
MCH: 29.4 pg (ref 26.6–33.0)
MCHC: 31.9 g/dL (ref 31.5–35.7)
MCV: 92 fL (ref 79–97)
Monocytes Absolute: 0.4 10*3/uL (ref 0.1–0.9)
Monocytes: 7 %
Neutrophils Absolute: 2.6 10*3/uL (ref 1.4–7.0)
Neutrophils: 55 %
Platelets: 274 10*3/uL (ref 150–450)
RBC: 5.1 x10E6/uL (ref 3.77–5.28)
RDW: 12.6 % (ref 11.7–15.4)
WBC: 4.9 10*3/uL (ref 3.4–10.8)

## 2023-05-26 LAB — HEMOGLOBIN A1C
Est. average glucose Bld gHb Est-mCnc: 117 mg/dL
Hgb A1c MFr Bld: 5.7 % — ABNORMAL HIGH (ref 4.8–5.6)

## 2023-05-26 LAB — VITAMIN D 25 HYDROXY (VIT D DEFICIENCY, FRACTURES): Vit D, 25-Hydroxy: 24.4 ng/mL — ABNORMAL LOW (ref 30.0–100.0)

## 2023-05-26 LAB — LIPID PANEL
Chol/HDL Ratio: 2.8 {ratio} (ref 0.0–4.4)
Cholesterol, Total: 251 mg/dL — ABNORMAL HIGH (ref 100–199)
HDL: 89 mg/dL (ref >39)
LDL Chol Calc (NIH): 146 mg/dL — ABNORMAL HIGH (ref 0–99)
Triglycerides: 97 mg/dL (ref 0–149)
VLDL Cholesterol Cal: 16 mg/dL (ref 5–40)

## 2023-05-26 LAB — TSH: TSH: 1.5 u[IU]/mL (ref 0.450–4.500)

## 2023-05-28 ENCOUNTER — Encounter: Payer: Self-pay | Admitting: Physician Assistant

## 2023-05-28 DIAGNOSIS — R7303 Prediabetes: Secondary | ICD-10-CM | POA: Insufficient documentation

## 2023-05-28 NOTE — Progress Notes (Signed)
Offie,   Vitamin D is better than years past but still low. How much vitamin D are you taking daily?   HDL, good cholesterol, looks great.  LDL, bad cholesterol, is elevated and not to goal.   ..The 10-year ASCVD risk score (Arnett DK, et al., 2019) is: 4.6%   Values used to calculate the score:     Age: 62 years     Sex: Female     Is Non-Hispanic African American: No     Diabetic: No     Tobacco smoker: No     Systolic Blood Pressure: 129 mmHg     Is BP treated: No     HDL Cholesterol: 89 mg/dL     Total Cholesterol: 251 mg/dL  Your 10 year is under 7.5 percent. Continue to work on low fat diet and regular exercise. Avoid processed/fried/fatty foods.   A1C is pre-diabetes range. Will recheck in 6 months. Processed foods and foods high in carbs without exercise can cause this number to rise.   Kidney and liver look great.   Thyroid wonderful.   Lesly Rubenstein

## 2023-05-29 DIAGNOSIS — Z23 Encounter for immunization: Secondary | ICD-10-CM | POA: Diagnosis not present

## 2023-05-29 NOTE — Addendum Note (Signed)
Addended by: Ernest Mallick A on: 05/29/2023 04:44 PM   Modules accepted: Orders

## 2023-07-26 ENCOUNTER — Ambulatory Visit: Payer: Commercial Managed Care - PPO

## 2024-03-13 ENCOUNTER — Telehealth: Payer: Self-pay

## 2024-03-13 DIAGNOSIS — Z1231 Encounter for screening mammogram for malignant neoplasm of breast: Secondary | ICD-10-CM

## 2024-03-13 NOTE — Telephone Encounter (Signed)
 Patient agreed to the mammogram.   Mammogram ordered.

## 2024-04-08 ENCOUNTER — Ambulatory Visit: Payer: Self-pay

## 2024-04-08 NOTE — Telephone Encounter (Signed)
 Specialist could not transfer call, Nt attempted to call patient back, no answer, left vm to return call to the office.      Copied from CRM #[id number]. Topic: Clinical - Red Word Triage >> Apr 08, 2024  8:23 AM Miquel SAILOR wrote: Red Word that prompted transfer to Nurse Triage: diarrhea/Crapping/Sweating/Both hands tiggling/2nd stool had blood/Went 4 more times still blood in stoll. Started on 08/31. Went to ER on same day and they did not do stool sample

## 2024-04-08 NOTE — Telephone Encounter (Signed)
 FYI Only or Action Required?: Action required by provider: request for appointment.  Patient was last seen in primary care on 05/25/2023 by Antoniette Vermell CROME, PA-C.  Called Nurse Triage reporting Rectal Bleeding.  Symptoms began several days ago.  Interventions attempted: Other: ED.  Symptoms are: unchanged.  Triage Disposition: See Physician Within 24 Hours  Patient/caregiver understands and will follow disposition?: YesReason for Disposition  MODERATE rectal bleeding (e.g., small blood clots, passing blood without stool, or toilet water turns red)  Answer Assessment - Initial Assessment Questions Pt was seen at [facility] ED on 8/31 for rectal bleeding. Pt stated all labs normal and ED provider was told to follow up with PCP to get referral for colonoscopy. Pt feels ok but wants to have this taken care asap. Last bloody stool was yesterday. Stool was formed with blood mixed. Pt denies any dizziness.      1. APPEARANCE of BLOOD: What color is it? Is it passed separately, on the surface of the stool, or mixed in with the stool?      Dark red  2. AMOUNT: How much blood was passed?      Small  3. FREQUENCY: How many times has blood been passed with the stools?      5 4. ONSET: When was the blood first seen in the stools? (Days or weeks)      Several days ago  5. DIARRHEA: Is there also some diarrhea? If Yes, ask: How many diarrhea stools in the past 24 hours?      2 6. CONSTIPATION: Do you have constipation? If Yes, ask: How bad is it?     denies 7. RECURRENT SYMPTOMS: Have you had blood in your stools before? If Yes, ask: When was the last time? and What happened that time?      denies 8. BLOOD THINNERS: Do you take any blood thinners? (e.g., aspirin, clopidogrel / Plavix, coumadin, heparin). Notes: Other strong blood thinners include: Arixtra (fondaparinux), Eliquis (apixaban), Pradaxa (dabigatran), and Xarelto (rivaroxaban).     denies 9. OTHER  SYMPTOMS: Do you have any other symptoms?  (e.g., abdomen pain, vomiting, dizziness, fever)     fatigue  Protocols used: Rectal Bleeding-A-AH

## 2024-04-08 NOTE — Telephone Encounter (Signed)
 Patient scheduled 04/09/2024 with Vermell Bologna,

## 2024-04-09 ENCOUNTER — Encounter: Payer: Self-pay | Admitting: Physician Assistant

## 2024-04-09 ENCOUNTER — Ambulatory Visit: Admitting: Physician Assistant

## 2024-04-09 VITALS — BP 132/74 | HR 78 | Wt 165.0 lb

## 2024-04-09 DIAGNOSIS — K625 Hemorrhage of anus and rectum: Secondary | ICD-10-CM | POA: Insufficient documentation

## 2024-04-09 DIAGNOSIS — R109 Unspecified abdominal pain: Secondary | ICD-10-CM | POA: Diagnosis not present

## 2024-04-09 NOTE — Progress Notes (Signed)
   Acute Office Visit  Subjective:     Patient ID: Leah Foley, female    DOB: [birth year], 63 y.o.   MRN: [id number]  No chief complaint on file.   HPI Patient is in today for follow up after ED visit for bloodly diarrhea and abdominal cramping on 04/06/2024. Pt was given IV fluids and checked hemoglobin. Hemoglobin was stable.WBC was normal.  No imaging was done. They suggested she get colonoscopy.   She did have cologuard done in 2022 that was negative. Her abdominal cramping has resolved. She has hx of abdominal cramping and diarrhea after eating eggs but she has never seen black tarry sticky stools. She did have eggs before noticing black stools.   No fever, chills, weight loss, night sweats, nausea or vomiting. Stools have been more formed but still dark.   ROS See HPI.      Objective:    BP 132/74 (BP Location: Right Arm, Patient Position: Sitting, Cuff Size: Normal)   Pulse 78   Wt 165 lb (74.8 kg)   BMI 26.63 kg/m  BP Readings from Last 3 Encounters:  04/09/24 132/74  05/25/23 129/85  02/09/23 115/78   Wt Readings from Last 3 Encounters:  04/09/24 165 lb (74.8 kg)  05/25/23 166 lb (75.3 kg)  09/27/22 173 lb (78.5 kg)      Physical Exam Constitutional:      Appearance: Normal appearance.  HENT:     Head: Normocephalic.  Abdominal:     General: Bowel sounds are normal. There is no distension.     Palpations: Abdomen is soft. There is no mass.     Tenderness: There is no abdominal tenderness. There is no right CVA tenderness, left CVA tenderness, guarding or rebound.     Hernia: No hernia is present.  Neurological:     General: No focal deficit present.     Mental Status: She is alert.  Psychiatric:        Mood and Affect: Mood normal.          Assessment & Plan:  SABRASABRADiagnoses and all orders for this visit:  Rectal bleeding -     Cancel: Ambulatory referral to Gastroenterology -     CBC w/Diff/Platelet -     Ambulatory referral to  Gastroenterology  Abdominal cramping -     Cancel: Ambulatory referral to Gastroenterology -     Ambulatory referral to Gastroenterology  No red flags on todays exam Abdomen non tender CBC ordered to follow WBC and hemoglobin Made urgent referral to get colonoscopy with digestive health-Dr. Amelie at patient's request  Vermell Bologna, PA-C

## 2024-04-09 NOTE — Patient Instructions (Addendum)
 Made referral today for [facility].  Follow up as needed.

## 2024-04-10 LAB — CBC WITH DIFFERENTIAL/PLATELET
Basophils Absolute: 0.1 x10E3/uL (ref 0.0–0.2)
Basos: 1 %
EOS (ABSOLUTE): 0.1 x10E3/uL (ref 0.0–0.4)
Eos: 2 %
Hematocrit: 44.5 % (ref 34.0–46.6)
Hemoglobin: 14.4 g/dL (ref 11.1–15.9)
Immature Grans (Abs): 0 x10E3/uL (ref 0.0–0.1)
Immature Granulocytes: 0 %
Lymphocytes Absolute: 1.9 x10E3/uL (ref 0.7–3.1)
Lymphs: 36 %
MCH: 30.3 pg (ref 26.6–33.0)
MCHC: 32.4 g/dL (ref 31.5–35.7)
MCV: 94 fL (ref 79–97)
Monocytes Absolute: 0.4 x10E3/uL (ref 0.1–0.9)
Monocytes: 8 %
Neutrophils Absolute: 2.8 x10E3/uL (ref 1.4–7.0)
Neutrophils: 53 %
Platelets: 269 x10E3/uL (ref 150–450)
RBC: 4.76 x10E6/uL (ref 3.77–5.28)
RDW: 12.9 % (ref 11.7–15.4)
WBC: 5.2 x10E3/uL (ref 3.4–10.8)

## 2024-04-11 ENCOUNTER — Ambulatory Visit: Payer: Self-pay | Admitting: Physician Assistant

## 2024-04-11 NOTE — Progress Notes (Signed)
 Hemoglobin stable.  No elevation of white blood count.

## 2024-05-06 LAB — HM COLONOSCOPY

## 2024-05-26 ENCOUNTER — Encounter: Payer: Commercial Managed Care - PPO | Admitting: Physician Assistant

## 2024-05-26 DIAGNOSIS — Z0000 Encounter for general adult medical examination without abnormal findings: Secondary | ICD-10-CM

## 2024-05-28 ENCOUNTER — Other Ambulatory Visit: Payer: Self-pay

## 2024-05-28 ENCOUNTER — Ambulatory Visit: Admitting: Physician Assistant

## 2024-05-28 VITALS — BP 122/78 | HR 66 | Ht 66.0 in | Wt 165.0 lb

## 2024-05-28 DIAGNOSIS — Z23 Encounter for immunization: Secondary | ICD-10-CM | POA: Diagnosis not present

## 2024-05-28 DIAGNOSIS — Z0000 Encounter for general adult medical examination without abnormal findings: Secondary | ICD-10-CM

## 2024-05-28 DIAGNOSIS — Z13820 Encounter for screening for osteoporosis: Secondary | ICD-10-CM

## 2024-05-28 DIAGNOSIS — Z1231 Encounter for screening mammogram for malignant neoplasm of breast: Secondary | ICD-10-CM | POA: Diagnosis not present

## 2024-05-28 NOTE — Patient Instructions (Signed)
 Health Maintenance After Age 65 After age 27, you are at a higher risk for certain long-term diseases and infections as well as injuries from falls. Falls are a major cause of broken bones and head injuries in people who are older than age 73. Getting regular preventive care can help to keep you healthy and well. Preventive care includes getting regular testing and making lifestyle changes as recommended by your health care provider. Talk with your health care provider about: Which screenings and tests you should have. A screening is a test that checks for a disease when you have no symptoms. A diet and exercise plan that is right for you. What should I know about screenings and tests to prevent falls? Screening and testing are the best ways to find a health problem early. Early diagnosis and treatment give you the best chance of managing medical conditions that are common after age 90. Certain conditions and lifestyle choices may make you more likely to have a fall. Your health care provider may recommend: Regular vision checks. Poor vision and conditions such as cataracts can make you more likely to have a fall. If you wear glasses, make sure to get your prescription updated if your vision changes. Medicine review. Work with your health care provider to regularly review all of the medicines you are taking, including over-the-counter medicines. Ask your health care provider about any side effects that may make you more likely to have a fall. Tell your health care provider if any medicines that you take make you feel dizzy or sleepy. Strength and balance checks. Your health care provider may recommend certain tests to check your strength and balance while standing, walking, or changing positions. Foot health exam. Foot pain and numbness, as well as not wearing proper footwear, can make you more likely to have a fall. Screenings, including: Osteoporosis screening. Osteoporosis is a condition that causes  the bones to get weaker and break more easily. Blood pressure screening. Blood pressure changes and medicines to control blood pressure can make you feel dizzy. Depression screening. You may be more likely to have a fall if you have a fear of falling, feel depressed, or feel unable to do activities that you used to do. Alcohol  use screening. Using too much alcohol  can affect your balance and may make you more likely to have a fall. Follow these instructions at home: Lifestyle Do not drink alcohol  if: Your health care provider tells you not to drink. If you drink alcohol : Limit how much you have to: 0-1 drink a day for women. 0-2 drinks a day for men. Know how much alcohol  is in your drink. In the [place] one drink equals one 12 oz bottle of beer (355 mL), one 5 oz glass of wine (148 mL), or one 1 oz glass of hard liquor (44 mL). Do not use any products that contain nicotine or tobacco. These products include cigarettes, chewing tobacco, and vaping devices, such as e-cigarettes. If you need help quitting, ask your health care provider. Activity  Follow a regular exercise program to stay fit. This will help you maintain your balance. Ask your health care provider what types of exercise are appropriate for you. If you need a cane or walker, use it as recommended by your health care provider. Wear supportive shoes that have nonskid soles. Safety  Remove any tripping hazards, such as rugs, cords, and clutter. Install safety equipment such as grab bars in bathrooms and safety rails on stairs. Keep rooms and walkways  well-lit. General instructions Talk with your health care provider about your risks for falling. Tell your health care provider if: You fall. Be sure to tell your health care provider about all falls, even ones that seem minor. You feel dizzy, tiredness (fatigue), or off-balance. Take over-the-counter and prescription medicines only as told by your health care provider. These include  supplements. Eat a healthy diet and maintain a healthy weight. A healthy diet includes low-fat dairy products, low-fat (lean) meats, and fiber from whole grains, beans, and lots of fruits and vegetables. Stay current with your vaccines. Schedule regular health, dental, and eye exams. Summary Having a healthy lifestyle and getting preventive care can help to protect your health and wellness after age 15. Screening and testing are the best way to find a health problem early and help you avoid having a fall. Early diagnosis and treatment give you the best chance for managing medical conditions that are more common for people who are older than age 42. Falls are a major cause of broken bones and head injuries in people who are older than age 64. Take precautions to prevent a fall at home. Work with your health care provider to learn what changes you can make to improve your health and wellness and to prevent falls. This information is not intended to replace advice given to you by your health care provider. Make sure you discuss any questions you have with your health care provider. Document Revised: 12/13/2020 Document Reviewed: 12/13/2020 Elsevier Patient Education  2024 [employer].

## 2024-05-28 NOTE — Progress Notes (Signed)
 Complete physical exam  Patient: Leah Foley   DOB: 06/18/1961   63 y.o. Female  MRN: [id number]  Subjective:    Chief Complaint  Patient presents with   Medical Management of Chronic Issues    .SABRADiscussed the use of AI scribe software for clinical note transcription with the patient, who gave verbal consent to proceed.  History of Present Illness Leah Foley is a 63 year old female who presents for a routine follow-up visit.  Colorectal cancer screening - Normal colonoscopy results at recent screening  Blood pressure elevation - Elevated blood pressure during a recent check - Attributes elevated readings to stress and physical activity, particularly when engaging with her granddaughter  Immunization status - Has not yet received influenza or pneumococcal vaccinations  Cancer and osteoporosis screening - Due for mammogram and bone density screening, not yet completed  Weight loss - Recent weight recorded at 160 pounds, down from 167 pounds at a recent hospital visit - Credits regular exercise and an active lifestyle for weight loss  Sleep quality - Falls asleep quickly once in bed - No reported sleep disturbances   Most recent fall risk assessment:    06/02/2024    6:19 AM  Fall Risk   Falls in the past year? 0  Number falls in past yr: 0  Injury with Fall? 0     Most recent depression screenings:    06/02/2024    6:19 AM 05/25/2023    2:24 PM  PHQ 2/9 Scores  PHQ - 2 Score 0 0    Vision:Within last year and Dental: No current dental problems and Receives regular dental care  Patient Active Problem List   Diagnosis Date Noted   Vitamin D  deficiency 05/29/2024   Rectal bleeding 04/09/2024   Abdominal cramping 04/09/2024   Pre-diabetes 05/28/2023   Post-menopausal 05/25/2023   Elevated LDL cholesterol level 05/23/2022   Irritated nevus of face 11/10/2021   Lateral epicondylitis of right elbow 02/11/2018   Partial tear of common extensor tendon  of elbow 02/11/2018   Dry eyes, bilateral 12/19/2017   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   Double vision 12/13/2017   No energy 12/13/2017   Acute non-recurrent ethmoidal sinusitis 07/21/2016   Single skin nodule 06/06/2016   Left knee pain 11/08/2015   Difficulty sleeping 09/22/2013   Need for prophylactic vaccination with combined diphtheria-tetanus-pertussis (DTP) vaccine 05/03/2013   Other and unspecified hyperlipidemia 05/03/2013   Allergic rhinitis 03/18/2013   Routine general medical examination at a health care facility 03/18/2013   Lipoma 03/18/2013   Past Medical History:  Diagnosis Date   Allergy    Double vision 12/13/2017   Family History  Problem Relation Age of Onset   Lupus Mother    Cancer Father    Kidney disease Maternal Grandfather    Allergies  Allergen Reactions   Azithromycin Palpitations   Sulfa Antibiotics Rash      Patient Care Team: Romen Yutzy L, PA-C as PCP - General (Family Medicine)   Outpatient Medications Prior to Visit  Medication Sig   clobetasol  ointment (TEMOVATE ) 0.05 % Apply 1 Application topically 2 (two) times daily.   ibuprofen (ADVIL,MOTRIN) 200 MG tablet Take 200 mg by mouth every 6 (six) hours as needed.   Multiple Vitamin (MULTIVITAMIN) tablet Take 1 tablet by mouth daily.   No facility-administered medications prior to visit.    Review of Systems  All other systems reviewed and are negative.         Objective:  BP 122/78   Pulse 66   Ht 5' 6 (1.676 m)   Wt 165 lb (74.8 kg)   SpO2 99%   BMI 26.63 kg/m  BP Readings from Last 3 Encounters:  05/28/24 122/78  04/09/24 132/74  05/25/23 129/85   Wt Readings from Last 3 Encounters:  05/28/24 165 lb (74.8 kg)  04/09/24 165 lb (74.8 kg)  05/25/23 166 lb (75.3 kg)      Physical Exam  BP 122/78   Pulse 66   Ht 5' 6 (1.676 m)   Wt 165 lb (74.8 kg)   SpO2 99%   BMI 26.63 kg/m   General Appearance:    Alert, cooperative, no distress, appears  stated age  Head:    Normocephalic, without obvious abnormality, atraumatic  Eyes:    PERRL, conjunctiva/corneas clear, EOM's intact, fundi    benign, both eyes  Ears:    Normal TM's and external ear canals, both ears  Nose:   Nares normal, septum midline, mucosa normal, no drainage    or sinus tenderness  Throat:   Lips, mucosa, and tongue normal; teeth and gums normal  Neck:   Supple, symmetrical, trachea midline, no adenopathy;    thyroid:  no enlargement/tenderness/nodules; no carotid   bruit or JVD  Back:     Symmetric, no curvature, ROM normal, no CVA tenderness  Lungs:     Clear to auscultation bilaterally, respirations unlabored  Chest Wall:    No tenderness or deformity   Heart:    Regular rate and rhythm, S1 and S2 normal, no murmur, rub   or gallop     Abdomen:     Soft, non-tender, bowel sounds active all four quadrants,    no masses, no organomegaly        Extremities:   Extremities normal, atraumatic, no cyanosis or edema  Pulses:   2+ and symmetric all extremities  Skin:   Skin color, texture, turgor normal, no rashes or lesions  Lymph nodes:   Cervical, supraclavicular, and axillary nodes normal  Neurologic:   CNII-XII intact, normal strength, sensation and reflexes    throughout      Assessment & Plan:    Routine Health Maintenance and Physical Exam  Immunization History  Administered Date(s) Administered   INFLUENZA, HIGH DOSE SEASONAL PF 05/28/2024   Influenza,inj,Quad PF,6+ Mos 06/02/2021   Tdap 03/18/2013, 05/29/2023    Health Maintenance  Topic Date Due   Mammogram  05/07/2014   DEXA SCAN  Never done   COVID-19 Vaccine (1) 06/13/2024 (Originally 01/10/1966)   Zoster Vaccines- Shingrix (1 of 2) 08/28/2024 (Originally 01/09/1978)   Pneumococcal Vaccine: 50+ Years (1 of 1 - PCV) 05/28/2025 (Originally 01/09/2009)   HIV Screening  06/05/2026 (Originally 01[birth year]09/1974)   Fecal DNA (Cologuard)  06/22/2024   DTaP/Tdap/Td (3 - Td or Tdap) 05/28/2033   Influenza  Vaccine  Completed   Hepatitis C Screening  Completed   Hepatitis B Vaccines 19-59 Average Risk  Aged Out   Meningococcal B Vaccine  Aged Out   Colonoscopy  Discontinued    Discussed health benefits of physical activity, and encouraged her to engage in regular exercise appropriate for her age and condition.  SABRAMayra was seen today for medical management of chronic issues.  Diagnoses and all orders for this visit:  Routine general medical examination at a health care facility -     DG Bone Density; Future -     MM 3D SCREENING MAMMOGRAM BILATERAL BREAST  Encounter for screening mammogram  for malignant neoplasm of breast -     MM 3D SCREENING MAMMOGRAM BILATERAL BREAST  Osteoporosis screening -     DG Bone Density; Future  Immunization due -     Flu vaccine HIGH DOSE PF(Fluzone Trivalent)    Assessment & Plan  Blood pressure normalized to 122/78 mmHg upon re-evaluation.  General Health Maintenance Due for routine screenings and vaccinations at age 30.  Recent colonoscopy with no abnormalities.  - Administer flu shot. - Schedule mammogram. - Schedule bone density screening. - discussed pneumonia vaccination, patient will consider in the future - Obtain fasting labs for cholesterol/diabetes screening and fill out work form.    Glenn Christo, PA-C

## 2024-05-29 ENCOUNTER — Ambulatory Visit: Payer: Self-pay | Admitting: Physician Assistant

## 2024-05-29 DIAGNOSIS — E7800 Pure hypercholesterolemia, unspecified: Secondary | ICD-10-CM

## 2024-05-29 DIAGNOSIS — E559 Vitamin D deficiency, unspecified: Secondary | ICD-10-CM | POA: Insufficient documentation

## 2024-05-29 LAB — CMP14+EGFR
ALT: 12 IU/L (ref 0–32)
AST: 18 IU/L (ref 0–40)
Albumin: 4.4 g/dL (ref 3.9–4.9)
Alkaline Phosphatase: 89 IU/L (ref 49–135)
BUN/Creatinine Ratio: 19 (ref 12–28)
BUN: 12 mg/dL (ref 8–27)
Bilirubin Total: 0.7 mg/dL (ref 0.0–1.2)
CO2: 23 mmol/L (ref 20–29)
Calcium: 9.6 mg/dL (ref 8.7–10.3)
Chloride: 100 mmol/L (ref 96–106)
Creatinine, Ser: 0.63 mg/dL (ref 0.57–1.00)
Globulin, Total: 2.4 g/dL (ref 1.5–4.5)
Glucose: 90 mg/dL (ref 70–99)
Potassium: 4.2 mmol/L (ref 3.5–5.2)
Sodium: 138 mmol/L (ref 134–144)
Total Protein: 6.8 g/dL (ref 6.0–8.5)
eGFR: 98 mL/min/1.73 (ref >59)

## 2024-05-29 LAB — CBC WITH DIFFERENTIAL/PLATELET
Basophils Absolute: 0.1 x10E3/uL (ref 0.0–0.2)
Basos: 1 %
EOS (ABSOLUTE): 0.2 x10E3/uL (ref 0.0–0.4)
Eos: 3 %
Hematocrit: 44.9 % (ref 34.0–46.6)
Hemoglobin: 14.8 g/dL (ref 11.1–15.9)
Immature Grans (Abs): 0 x10E3/uL (ref 0.0–0.1)
Immature Granulocytes: 0 %
Lymphocytes Absolute: 1.6 x10E3/uL (ref 0.7–3.1)
Lymphs: 35 %
MCH: 29.8 pg (ref 26.6–33.0)
MCHC: 33 g/dL (ref 31.5–35.7)
MCV: 90 fL (ref 79–97)
Monocytes Absolute: 0.3 x10E3/uL (ref 0.1–0.9)
Monocytes: 7 %
Neutrophils Absolute: 2.4 x10E3/uL (ref 1.4–7.0)
Neutrophils: 54 %
Platelets: 279 x10E3/uL (ref 150–450)
RBC: 4.97 x10E6/uL (ref 3.77–5.28)
RDW: 13 % (ref 11.7–15.4)
WBC: 4.5 x10E3/uL (ref 3.4–10.8)

## 2024-05-29 LAB — VITAMIN D 25 HYDROXY (VIT D DEFICIENCY, FRACTURES): Vit D, 25-Hydroxy: 16.1 ng/mL — ABNORMAL LOW (ref 30.0–100.0)

## 2024-05-29 LAB — TSH: TSH: 1.68 u[IU]/mL (ref 0.450–4.500)

## 2024-05-29 LAB — LIPID PANEL
Chol/HDL Ratio: 3.2 ratio (ref 0.0–4.4)
Cholesterol, Total: 257 mg/dL — ABNORMAL HIGH (ref 100–199)
HDL: 80 mg/dL (ref >39)
LDL Chol Calc (NIH): 157 mg/dL — ABNORMAL HIGH (ref 0–99)
Triglycerides: 114 mg/dL (ref 0–149)
VLDL Cholesterol Cal: 20 mg/dL (ref 5–40)

## 2024-05-29 LAB — VITAMIN B12: Vitamin B-12: 343 pg/mL (ref 232–1245)

## 2024-05-29 NOTE — Progress Notes (Signed)
 Leah Foley please fill in form for work and fax for patient with lab results.   B12 on low side. Make sure taking 1000mcg daily.  Vitamin D  really low. Make sure taking vitamin D  2000 units daily with dairy for better absorption.   Cholesterol not to goal.  LDL increased a bit.   Work on diet: decrease fatty/fried/processed foods. Aim for 150 minutes of walking a week.   [name].The 10-year ASCVD risk score (Arnett DK, et al., 2019) is: 5%   Values used to calculate the score:     Age: 63 years     Clincally relevant sex: Female     Is Non-Hispanic African American: No     Diabetic: No     Tobacco smoker: No     Systolic Blood Pressure: 122 mmHg     Is BP treated: No     HDL Cholesterol: 80 mg/dL     Total Cholesterol: 257 mg/dL    Thyroid, kidney, liver, glucose look good.

## 2024-05-29 NOTE — Telephone Encounter (Signed)
 Called patient to inform that paperwork has been faxed, and ready for pick up, thanks.

## 2024-06-02 ENCOUNTER — Encounter: Payer: Self-pay | Admitting: Physician Assistant

## 2025-05-29 ENCOUNTER — Ambulatory Visit: Admitting: Physician Assistant
